# Patient Record
Sex: Female | Born: 1965 | Race: White | Hispanic: No | Marital: Married | Smoking: Former smoker
Health system: Southern US, Community
[De-identification: ages and names within clinical notes are randomized; demographics above are authoritative.]

## PROBLEM LIST (undated history)

## (undated) DIAGNOSIS — C801 Malignant (primary) neoplasm, unspecified: Secondary | ICD-10-CM

## (undated) HISTORY — PX: ABDOMINAL HYSTERECTOMY: SHX81

## (undated) HISTORY — PX: BREAST SURGERY: SHX581

## (undated) HISTORY — DX: Malignant (primary) neoplasm, unspecified: C80.1

## (undated) HISTORY — PX: CHOLECYSTECTOMY: SHX55

---

## 1998-02-16 ENCOUNTER — Other Ambulatory Visit: Admission: RE | Admit: 1998-02-16 | Discharge: 1998-02-16 | Payer: Self-pay | Admitting: Obstetrics and Gynecology

## 1998-09-14 ENCOUNTER — Emergency Department (HOSPITAL_COMMUNITY): Admission: EM | Admit: 1998-09-14 | Discharge: 1998-09-15 | Payer: Self-pay | Admitting: Emergency Medicine

## 1998-09-15 ENCOUNTER — Encounter: Payer: Self-pay | Admitting: Emergency Medicine

## 1999-08-09 ENCOUNTER — Other Ambulatory Visit: Admission: RE | Admit: 1999-08-09 | Discharge: 1999-08-09 | Payer: Self-pay | Admitting: Obstetrics and Gynecology

## 1999-08-29 ENCOUNTER — Encounter: Admission: RE | Admit: 1999-08-29 | Discharge: 1999-08-29 | Payer: Self-pay | Admitting: Obstetrics and Gynecology

## 1999-08-29 ENCOUNTER — Encounter: Payer: Self-pay | Admitting: Obstetrics and Gynecology

## 2000-01-26 ENCOUNTER — Encounter: Payer: Self-pay | Admitting: Obstetrics and Gynecology

## 2000-01-26 ENCOUNTER — Encounter: Admission: RE | Admit: 2000-01-26 | Discharge: 2000-01-26 | Payer: Self-pay | Admitting: Obstetrics and Gynecology

## 2000-08-30 ENCOUNTER — Other Ambulatory Visit: Admission: RE | Admit: 2000-08-30 | Discharge: 2000-08-30 | Payer: Self-pay | Admitting: Obstetrics and Gynecology

## 2001-03-13 ENCOUNTER — Encounter: Payer: Self-pay | Admitting: Obstetrics & Gynecology

## 2001-03-13 ENCOUNTER — Ambulatory Visit (HOSPITAL_COMMUNITY): Admission: RE | Admit: 2001-03-13 | Discharge: 2001-03-13 | Payer: Self-pay | Admitting: Obstetrics & Gynecology

## 2001-05-15 ENCOUNTER — Inpatient Hospital Stay (HOSPITAL_COMMUNITY): Admission: RE | Admit: 2001-05-15 | Discharge: 2001-05-17 | Payer: Self-pay | Admitting: Obstetrics and Gynecology

## 2001-05-15 ENCOUNTER — Encounter (INDEPENDENT_AMBULATORY_CARE_PROVIDER_SITE_OTHER): Payer: Self-pay

## 2001-06-13 ENCOUNTER — Other Ambulatory Visit: Admission: RE | Admit: 2001-06-13 | Discharge: 2001-06-13 | Payer: Self-pay | Admitting: Obstetrics and Gynecology

## 2003-06-16 ENCOUNTER — Emergency Department (HOSPITAL_COMMUNITY): Admission: AD | Admit: 2003-06-16 | Discharge: 2003-06-16 | Payer: Self-pay | Admitting: Family Medicine

## 2003-10-06 ENCOUNTER — Other Ambulatory Visit: Admission: RE | Admit: 2003-10-06 | Discharge: 2003-10-06 | Payer: Self-pay | Admitting: Obstetrics and Gynecology

## 2003-12-23 ENCOUNTER — Ambulatory Visit (HOSPITAL_COMMUNITY): Admission: RE | Admit: 2003-12-23 | Discharge: 2003-12-23 | Payer: Self-pay | Admitting: Obstetrics and Gynecology

## 2003-12-23 ENCOUNTER — Encounter (INDEPENDENT_AMBULATORY_CARE_PROVIDER_SITE_OTHER): Payer: Self-pay | Admitting: Specialist

## 2004-06-20 ENCOUNTER — Emergency Department (HOSPITAL_COMMUNITY): Admission: EM | Admit: 2004-06-20 | Discharge: 2004-06-20 | Payer: Self-pay | Admitting: Family Medicine

## 2005-10-29 ENCOUNTER — Encounter: Admission: RE | Admit: 2005-10-29 | Discharge: 2005-10-29 | Payer: Self-pay | Admitting: Obstetrics and Gynecology

## 2005-11-10 ENCOUNTER — Encounter: Admission: RE | Admit: 2005-11-10 | Discharge: 2005-11-10 | Payer: Self-pay | Admitting: Obstetrics and Gynecology

## 2007-01-01 ENCOUNTER — Ambulatory Visit (HOSPITAL_COMMUNITY): Admission: RE | Admit: 2007-01-01 | Discharge: 2007-01-01 | Payer: Self-pay | Admitting: General Surgery

## 2007-07-05 ENCOUNTER — Inpatient Hospital Stay (HOSPITAL_COMMUNITY): Admission: AD | Admit: 2007-07-05 | Discharge: 2007-07-05 | Payer: Self-pay | Admitting: Obstetrics & Gynecology

## 2007-07-16 ENCOUNTER — Ambulatory Visit (HOSPITAL_COMMUNITY): Admission: RE | Admit: 2007-07-16 | Discharge: 2007-07-16 | Payer: Self-pay | Admitting: Obstetrics and Gynecology

## 2007-07-16 ENCOUNTER — Encounter (INDEPENDENT_AMBULATORY_CARE_PROVIDER_SITE_OTHER): Payer: Self-pay | Admitting: Obstetrics and Gynecology

## 2007-08-31 DIAGNOSIS — C801 Malignant (primary) neoplasm, unspecified: Secondary | ICD-10-CM

## 2007-08-31 HISTORY — DX: Malignant (primary) neoplasm, unspecified: C80.1

## 2007-09-25 ENCOUNTER — Encounter: Admission: RE | Admit: 2007-09-25 | Discharge: 2007-09-25 | Payer: Self-pay | Admitting: Obstetrics and Gynecology

## 2007-10-02 ENCOUNTER — Ambulatory Visit: Payer: Self-pay | Admitting: Oncology

## 2007-10-03 ENCOUNTER — Encounter: Admission: RE | Admit: 2007-10-03 | Discharge: 2007-10-03 | Payer: Self-pay | Admitting: Obstetrics and Gynecology

## 2007-10-08 LAB — COMPREHENSIVE METABOLIC PANEL
Albumin: 4.4 g/dL (ref 3.5–5.2)
Alkaline Phosphatase: 45 U/L (ref 39–117)
BUN: 10 mg/dL (ref 6–23)
Calcium: 8.9 mg/dL (ref 8.4–10.5)
Glucose, Bld: 88 mg/dL (ref 70–99)
Potassium: 4.1 mEq/L (ref 3.5–5.3)

## 2007-10-08 LAB — CBC WITH DIFFERENTIAL/PLATELET
BASO%: 1.9 % (ref 0.0–2.0)
Basophils Absolute: 0.1 10*3/uL (ref 0.0–0.1)
EOS%: 1.5 % (ref 0.0–7.0)
HCT: 39.2 % (ref 34.8–46.6)
HGB: 13.8 g/dL (ref 11.6–15.9)
LYMPH%: 44.5 % (ref 14.0–48.0)
MCH: 32 pg (ref 26.0–34.0)
MCHC: 35.3 g/dL (ref 32.0–36.0)
MCV: 90.8 fL (ref 81.0–101.0)
MONO%: 10.5 % (ref 0.0–13.0)
NEUT%: 41.6 % (ref 39.6–76.8)

## 2007-10-08 LAB — CANCER ANTIGEN 27.29: CA 27.29: 19 U/mL (ref 0–39)

## 2007-10-08 LAB — LACTATE DEHYDROGENASE: LDH: 105 U/L (ref 94–250)

## 2007-10-09 LAB — VITAMIN D 25 HYDROXY (VIT D DEFICIENCY, FRACTURES): Vit D, 25-Hydroxy: 46 ng/mL (ref 30–89)

## 2007-10-10 ENCOUNTER — Encounter: Payer: Self-pay | Admitting: Oncology

## 2007-10-10 ENCOUNTER — Ambulatory Visit: Payer: Self-pay

## 2007-10-13 ENCOUNTER — Encounter: Admission: RE | Admit: 2007-10-13 | Discharge: 2007-10-13 | Payer: Self-pay | Admitting: Oncology

## 2007-10-14 ENCOUNTER — Ambulatory Visit (HOSPITAL_BASED_OUTPATIENT_CLINIC_OR_DEPARTMENT_OTHER): Admission: RE | Admit: 2007-10-14 | Discharge: 2007-10-14 | Payer: Self-pay | Admitting: General Surgery

## 2007-10-21 ENCOUNTER — Ambulatory Visit (HOSPITAL_COMMUNITY): Admission: RE | Admit: 2007-10-21 | Discharge: 2007-10-21 | Payer: Self-pay | Admitting: Hematology and Oncology

## 2007-10-23 ENCOUNTER — Ambulatory Visit (HOSPITAL_COMMUNITY): Admission: RE | Admit: 2007-10-23 | Discharge: 2007-10-23 | Payer: Self-pay | Admitting: Oncology

## 2007-10-31 LAB — CBC WITH DIFFERENTIAL/PLATELET
EOS%: 0.1 % (ref 0.0–7.0)
MCH: 31.7 pg (ref 26.0–34.0)
MCV: 91 fL (ref 81.0–101.0)
MONO%: 10.5 % (ref 0.0–13.0)
RBC: 4.22 10*6/uL (ref 3.70–5.32)
RDW: 11.6 % (ref 11.3–14.5)

## 2007-10-31 LAB — COMPREHENSIVE METABOLIC PANEL
AST: 16 U/L (ref 0–37)
Albumin: 4.1 g/dL (ref 3.5–5.2)
Alkaline Phosphatase: 58 U/L (ref 39–117)
Potassium: 4.3 mEq/L (ref 3.5–5.3)
Sodium: 139 mEq/L (ref 135–145)
Total Protein: 6.5 g/dL (ref 6.0–8.3)

## 2007-11-07 LAB — CBC WITH DIFFERENTIAL/PLATELET
BASO%: 1.7 % (ref 0.0–2.0)
EOS%: 0.7 % (ref 0.0–7.0)
HCT: 37.5 % (ref 34.8–46.6)
MCH: 31.7 pg (ref 26.0–34.0)
MCHC: 34.4 g/dL (ref 32.0–36.0)
MONO#: 0.9 10*3/uL (ref 0.1–0.9)
RBC: 4.07 10*6/uL (ref 3.70–5.32)
RDW: 12.1 % (ref 11.3–14.5)
WBC: 5.9 10*3/uL (ref 3.9–10.0)
lymph#: 2.5 10*3/uL (ref 0.9–3.3)

## 2007-11-07 LAB — COMPREHENSIVE METABOLIC PANEL
ALT: 42 U/L — ABNORMAL HIGH (ref 0–35)
AST: 21 U/L (ref 0–37)
Albumin: 4 g/dL (ref 3.5–5.2)
CO2: 24 mEq/L (ref 19–32)
Calcium: 8.7 mg/dL (ref 8.4–10.5)
Chloride: 106 mEq/L (ref 96–112)
Creatinine, Ser: 0.64 mg/dL (ref 0.40–1.20)
Potassium: 4.4 mEq/L (ref 3.5–5.3)
Total Protein: 6.3 g/dL (ref 6.0–8.3)

## 2007-11-12 ENCOUNTER — Ambulatory Visit: Payer: Self-pay | Admitting: Oncology

## 2007-11-14 LAB — BASIC METABOLIC PANEL
BUN: 16 mg/dL (ref 6–23)
CO2: 24 mEq/L (ref 19–32)
Chloride: 103 mEq/L (ref 96–112)
Creatinine, Ser: 0.58 mg/dL (ref 0.40–1.20)
Potassium: 4.2 mEq/L (ref 3.5–5.3)

## 2007-11-14 LAB — CBC WITH DIFFERENTIAL/PLATELET
Basophils Absolute: 0.1 10*3/uL (ref 0.0–0.1)
EOS%: 2 % (ref 0.0–7.0)
HCT: 38 % (ref 34.8–46.6)
HGB: 13.2 g/dL (ref 11.6–15.9)
MONO#: 0.4 10*3/uL (ref 0.1–0.9)
NEUT#: 2.5 10*3/uL (ref 1.5–6.5)
NEUT%: 52.3 % (ref 39.6–76.8)
RDW: 11.5 % (ref 11.3–14.5)
WBC: 4.8 10*3/uL (ref 3.9–10.0)
lymph#: 1.7 10*3/uL (ref 0.9–3.3)

## 2007-11-21 LAB — COMPREHENSIVE METABOLIC PANEL
Albumin: 3.8 g/dL (ref 3.5–5.2)
Alkaline Phosphatase: 50 U/L (ref 39–117)
BUN: 14 mg/dL (ref 6–23)
CO2: 26 mEq/L (ref 19–32)
Glucose, Bld: 116 mg/dL — ABNORMAL HIGH (ref 70–99)
Total Bilirubin: 0.4 mg/dL (ref 0.3–1.2)
Total Protein: 6.1 g/dL (ref 6.0–8.3)

## 2007-11-21 LAB — CBC WITH DIFFERENTIAL/PLATELET
Basophils Absolute: 0.1 10*3/uL (ref 0.0–0.1)
EOS%: 0.6 % (ref 0.0–7.0)
Eosinophils Absolute: 0 10*3/uL (ref 0.0–0.5)
HGB: 12.6 g/dL (ref 11.6–15.9)
LYMPH%: 39.1 % (ref 14.0–48.0)
MCH: 31.8 pg (ref 26.0–34.0)
MCV: 89.4 fL (ref 81.0–101.0)
MONO%: 7.8 % (ref 0.0–13.0)
NEUT#: 3.1 10*3/uL (ref 1.5–6.5)
Platelets: 183 10*3/uL (ref 145–400)
RDW: 12.2 % (ref 11.3–14.5)

## 2007-11-21 LAB — URINALYSIS, MICROSCOPIC - CHCC
Bilirubin (Urine): NEGATIVE
Blood: NEGATIVE
Glucose: NEGATIVE g/dL
Ketones: NEGATIVE mg/dL
Leukocyte Esterase: NEGATIVE
Nitrite: NEGATIVE
Protein: NEGATIVE mg/dL
RBC count: NEGATIVE (ref 0–2)
Specific Gravity, Urine: 1.03 (ref 1.003–1.035)
WBC, UA: NEGATIVE (ref 0–2)
pH: 6 (ref 4.6–8.0)

## 2007-11-27 LAB — CLOSTRIDIUM DIFFICILE EIA

## 2007-11-28 LAB — CBC WITH DIFFERENTIAL/PLATELET
BASO%: 1.2 % (ref 0.0–2.0)
Basophils Absolute: 0 10e3/uL (ref 0.0–0.1)
EOS%: 0.3 % (ref 0.0–7.0)
Eosinophils Absolute: 0 10e3/uL (ref 0.0–0.5)
HCT: 35.7 % (ref 34.8–46.6)
HGB: 12.3 g/dL (ref 11.6–15.9)
LYMPH%: 53.1 % — ABNORMAL HIGH (ref 14.0–48.0)
MCH: 31.4 pg (ref 26.0–34.0)
MCHC: 34.6 g/dL (ref 32.0–36.0)
MCV: 90.9 fL (ref 81.0–101.0)
MONO#: 0.3 10e3/uL (ref 0.1–0.9)
MONO%: 8.2 % (ref 0.0–13.0)
NEUT#: 1.4 10e3/uL — ABNORMAL LOW (ref 1.5–6.5)
NEUT%: 37.3 % — ABNORMAL LOW (ref 39.6–76.8)
Platelets: 155 10e3/uL (ref 145–400)
RBC: 3.93 10e6/uL (ref 3.70–5.32)
RDW: 12.7 % (ref 11.3–14.5)
WBC: 3.7 10e3/uL — ABNORMAL LOW (ref 3.9–10.0)
lymph#: 2 10e3/uL (ref 0.9–3.3)

## 2007-11-28 LAB — COMPREHENSIVE METABOLIC PANEL
ALT: 83 U/L — ABNORMAL HIGH (ref 0–35)
Albumin: 4.1 g/dL (ref 3.5–5.2)
Alkaline Phosphatase: 57 U/L (ref 39–117)
CO2: 25 mEq/L (ref 19–32)
Glucose, Bld: 90 mg/dL (ref 70–99)
Potassium: 3.9 mEq/L (ref 3.5–5.3)
Sodium: 136 mEq/L (ref 135–145)
Total Bilirubin: 0.8 mg/dL (ref 0.3–1.2)
Total Protein: 6.5 g/dL (ref 6.0–8.3)

## 2007-12-05 LAB — CBC WITH DIFFERENTIAL/PLATELET
EOS%: 0.3 % (ref 0.0–7.0)
Eosinophils Absolute: 0 10*3/uL (ref 0.0–0.5)
LYMPH%: 53.7 % — ABNORMAL HIGH (ref 14.0–48.0)
MCH: 31.9 pg (ref 26.0–34.0)
MCV: 93.1 fL (ref 81.0–101.0)
MONO%: 14.2 % — ABNORMAL HIGH (ref 0.0–13.0)
NEUT#: 1 10*3/uL — ABNORMAL LOW (ref 1.5–6.5)
Platelets: 174 10*3/uL (ref 145–400)
RBC: 3.87 10*6/uL (ref 3.70–5.32)

## 2007-12-05 LAB — COMPREHENSIVE METABOLIC PANEL
ALT: 78 U/L — ABNORMAL HIGH (ref 0–35)
AST: 43 U/L — ABNORMAL HIGH (ref 0–37)
Alkaline Phosphatase: 59 U/L (ref 39–117)
Glucose, Bld: 112 mg/dL — ABNORMAL HIGH (ref 70–99)
Potassium: 4.1 mEq/L (ref 3.5–5.3)
Sodium: 141 mEq/L (ref 135–145)
Total Bilirubin: 0.6 mg/dL (ref 0.3–1.2)
Total Protein: 6.5 g/dL (ref 6.0–8.3)

## 2007-12-08 LAB — VITAMIN D 25 HYDROXY (VIT D DEFICIENCY, FRACTURES): Vit D, 25-Hydroxy: 39 ng/mL (ref 30–89)

## 2007-12-12 LAB — COMPREHENSIVE METABOLIC PANEL
Albumin: 3.8 g/dL (ref 3.5–5.2)
Alkaline Phosphatase: 58 U/L (ref 39–117)
CO2: 27 mEq/L (ref 19–32)
Calcium: 8.8 mg/dL (ref 8.4–10.5)
Chloride: 105 mEq/L (ref 96–112)
Glucose, Bld: 97 mg/dL (ref 70–99)
Potassium: 4 mEq/L (ref 3.5–5.3)
Sodium: 142 mEq/L (ref 135–145)
Total Protein: 6 g/dL (ref 6.0–8.3)

## 2007-12-12 LAB — CBC WITH DIFFERENTIAL/PLATELET
Basophils Absolute: 0.1 10*3/uL (ref 0.0–0.1)
Eosinophils Absolute: 0 10*3/uL (ref 0.0–0.5)
HGB: 11.6 g/dL (ref 11.6–15.9)
MONO#: 0.3 10*3/uL (ref 0.1–0.9)
MONO%: 7.2 % (ref 0.0–13.0)
NEUT#: 1.4 10*3/uL — ABNORMAL LOW (ref 1.5–6.5)
RBC: 3.61 10*6/uL — ABNORMAL LOW (ref 3.70–5.32)
RDW: 14.4 % (ref 11.3–14.5)
WBC: 4.5 10*3/uL (ref 3.9–10.0)
lymph#: 2.7 10*3/uL (ref 0.9–3.3)

## 2007-12-23 ENCOUNTER — Encounter: Admission: RE | Admit: 2007-12-23 | Discharge: 2007-12-23 | Payer: Self-pay | Admitting: Oncology

## 2007-12-24 ENCOUNTER — Encounter: Payer: Self-pay | Admitting: Oncology

## 2007-12-24 ENCOUNTER — Ambulatory Visit: Admission: RE | Admit: 2007-12-24 | Discharge: 2007-12-24 | Payer: Self-pay | Admitting: Oncology

## 2007-12-24 ENCOUNTER — Ambulatory Visit: Payer: Self-pay | Admitting: Cardiology

## 2007-12-24 ENCOUNTER — Ambulatory Visit: Payer: Self-pay | Admitting: Oncology

## 2007-12-26 LAB — COMPREHENSIVE METABOLIC PANEL
AST: 23 U/L (ref 0–37)
Albumin: 4.1 g/dL (ref 3.5–5.2)
Alkaline Phosphatase: 61 U/L (ref 39–117)
Glucose, Bld: 139 mg/dL — ABNORMAL HIGH (ref 70–99)
Potassium: 3.8 mEq/L (ref 3.5–5.3)
Sodium: 142 mEq/L (ref 135–145)
Total Protein: 6.3 g/dL (ref 6.0–8.3)

## 2007-12-26 LAB — CBC WITH DIFFERENTIAL/PLATELET
EOS%: 0.8 % (ref 0.0–7.0)
Eosinophils Absolute: 0 10*3/uL (ref 0.0–0.5)
MCV: 95.1 fL (ref 81.0–101.0)
MONO%: 11.6 % (ref 0.0–13.0)
NEUT#: 1.1 10*3/uL — ABNORMAL LOW (ref 1.5–6.5)
RBC: 3.36 10*6/uL — ABNORMAL LOW (ref 3.70–5.32)
RDW: 15.9 % — ABNORMAL HIGH (ref 11.3–14.5)
lymph#: 2.3 10*3/uL (ref 0.9–3.3)

## 2008-01-02 ENCOUNTER — Encounter: Payer: Self-pay | Admitting: Internal Medicine

## 2008-01-02 LAB — CBC WITH DIFFERENTIAL/PLATELET
Basophils Absolute: 0.1 10*3/uL (ref 0.0–0.1)
Eosinophils Absolute: 0 10*3/uL (ref 0.0–0.5)
HCT: 34.2 % — ABNORMAL LOW (ref 34.8–46.6)
LYMPH%: 50.5 % — ABNORMAL HIGH (ref 14.0–48.0)
MCV: 94.8 fL (ref 81.0–101.0)
MONO#: 0.7 10*3/uL (ref 0.1–0.9)
MONO%: 14.8 % — ABNORMAL HIGH (ref 0.0–13.0)
NEUT#: 1.5 10*3/uL (ref 1.5–6.5)
NEUT%: 32.5 % — ABNORMAL LOW (ref 39.6–76.8)
Platelets: 165 10*3/uL (ref 145–400)
WBC: 4.6 10*3/uL (ref 3.9–10.0)

## 2008-01-02 LAB — COMPREHENSIVE METABOLIC PANEL
Alkaline Phosphatase: 62 U/L (ref 39–117)
CO2: 25 mEq/L (ref 19–32)
Creatinine, Ser: 0.62 mg/dL (ref 0.40–1.20)
Glucose, Bld: 98 mg/dL (ref 70–99)
Sodium: 140 mEq/L (ref 135–145)
Total Bilirubin: 0.6 mg/dL (ref 0.3–1.2)
Total Protein: 6.4 g/dL (ref 6.0–8.3)

## 2008-01-09 LAB — CBC WITH DIFFERENTIAL/PLATELET
BASO%: 1.7 % (ref 0.0–2.0)
EOS%: 0.9 % (ref 0.0–7.0)
HCT: 32.5 % — ABNORMAL LOW (ref 34.8–46.6)
LYMPH%: 59 % — ABNORMAL HIGH (ref 14.0–48.0)
MCH: 33.4 pg (ref 26.0–34.0)
MCHC: 35.9 g/dL (ref 32.0–36.0)
MCV: 92.8 fL (ref 81.0–101.0)
MONO#: 0.3 10*3/uL (ref 0.1–0.9)
MONO%: 6.9 % (ref 0.0–13.0)
NEUT%: 31.6 % — ABNORMAL LOW (ref 39.6–76.8)
Platelets: 100 10*3/uL — ABNORMAL LOW (ref 145–400)
RBC: 3.51 10*6/uL — ABNORMAL LOW (ref 3.70–5.32)

## 2008-01-16 ENCOUNTER — Encounter: Payer: Self-pay | Admitting: Internal Medicine

## 2008-01-16 LAB — CBC WITH DIFFERENTIAL/PLATELET
BASO%: 1.4 % (ref 0.0–2.0)
EOS%: 0.7 % (ref 0.0–7.0)
HCT: 31.1 % — ABNORMAL LOW (ref 34.8–46.6)
LYMPH%: 65.1 % — ABNORMAL HIGH (ref 14.0–48.0)
MCH: 33.7 pg (ref 26.0–34.0)
MCHC: 35.8 g/dL (ref 32.0–36.0)
NEUT%: 26.6 % — ABNORMAL LOW (ref 39.6–76.8)
RBC: 3.31 10*6/uL — ABNORMAL LOW (ref 3.70–5.32)
lymph#: 2.4 10*3/uL (ref 0.9–3.3)

## 2008-01-23 LAB — CBC WITH DIFFERENTIAL/PLATELET
BASO%: 1.1 % (ref 0.0–2.0)
EOS%: 1.1 % (ref 0.0–7.0)
HGB: 10.1 g/dL — ABNORMAL LOW (ref 11.6–15.9)
MCH: 34.1 pg — ABNORMAL HIGH (ref 26.0–34.0)
MCHC: 35.9 g/dL (ref 32.0–36.0)
MONO#: 0.4 10*3/uL (ref 0.1–0.9)
RDW: 15.1 % — ABNORMAL HIGH (ref 11.3–14.5)
WBC: 3.2 10*3/uL — ABNORMAL LOW (ref 3.9–10.0)
lymph#: 2 10*3/uL (ref 0.9–3.3)

## 2008-01-29 ENCOUNTER — Encounter: Payer: Self-pay | Admitting: Internal Medicine

## 2008-01-29 LAB — COMPREHENSIVE METABOLIC PANEL
Albumin: 3.9 g/dL (ref 3.5–5.2)
Alkaline Phosphatase: 58 U/L (ref 39–117)
BUN: 16 mg/dL (ref 6–23)
Calcium: 8.7 mg/dL (ref 8.4–10.5)
Glucose, Bld: 103 mg/dL — ABNORMAL HIGH (ref 70–99)
Potassium: 4.1 mEq/L (ref 3.5–5.3)

## 2008-01-29 LAB — CBC WITH DIFFERENTIAL/PLATELET
BASO%: 1.1 % (ref 0.0–2.0)
Eosinophils Absolute: 0 10*3/uL (ref 0.0–0.5)
LYMPH%: 65.3 % — ABNORMAL HIGH (ref 14.0–48.0)
MONO#: 0.3 10*3/uL (ref 0.1–0.9)
NEUT#: 0.7 10*3/uL — ABNORMAL LOW (ref 1.5–6.5)
Platelets: 117 10*3/uL — ABNORMAL LOW (ref 145–400)
RBC: 2.92 10*6/uL — ABNORMAL LOW (ref 3.70–5.32)
RDW: 16 % — ABNORMAL HIGH (ref 11.3–14.5)
WBC: 3.1 10*3/uL — ABNORMAL LOW (ref 3.9–10.0)
lymph#: 2 10*3/uL (ref 0.9–3.3)

## 2008-02-03 ENCOUNTER — Ambulatory Visit: Payer: Self-pay | Admitting: Oncology

## 2008-02-06 LAB — CBC WITH DIFFERENTIAL/PLATELET
Basophils Absolute: 0 10*3/uL (ref 0.0–0.1)
Eosinophils Absolute: 0 10*3/uL (ref 0.0–0.5)
HGB: 10.4 g/dL — ABNORMAL LOW (ref 11.6–15.9)
MCV: 99 fL (ref 81.0–101.0)
MONO#: 0.5 10*3/uL (ref 0.1–0.9)
NEUT#: 0.7 10*3/uL — ABNORMAL LOW (ref 1.5–6.5)
RDW: 15.8 % — ABNORMAL HIGH (ref 11.3–14.5)
lymph#: 2.1 10*3/uL (ref 0.9–3.3)

## 2008-02-13 ENCOUNTER — Encounter: Payer: Self-pay | Admitting: Internal Medicine

## 2008-02-13 LAB — CBC WITH DIFFERENTIAL/PLATELET
Basophils Absolute: 0 10*3/uL (ref 0.0–0.1)
Eosinophils Absolute: 0 10*3/uL (ref 0.0–0.5)
HGB: 10.3 g/dL — ABNORMAL LOW (ref 11.6–15.9)
LYMPH%: 56.5 % — ABNORMAL HIGH (ref 14.0–48.0)
MCV: 98.7 fL (ref 81.0–101.0)
MONO%: 14.5 % — ABNORMAL HIGH (ref 0.0–13.0)
NEUT#: 0.9 10*3/uL — ABNORMAL LOW (ref 1.5–6.5)
Platelets: 300 10*3/uL (ref 145–400)

## 2008-02-13 LAB — COMPREHENSIVE METABOLIC PANEL
Albumin: 3.7 g/dL (ref 3.5–5.2)
Alkaline Phosphatase: 60 U/L (ref 39–117)
BUN: 9 mg/dL (ref 6–23)
CO2: 25 mEq/L (ref 19–32)
Glucose, Bld: 127 mg/dL — ABNORMAL HIGH (ref 70–99)
Potassium: 3.9 mEq/L (ref 3.5–5.3)
Total Bilirubin: 0.4 mg/dL (ref 0.3–1.2)

## 2008-02-20 ENCOUNTER — Encounter: Payer: Self-pay | Admitting: Internal Medicine

## 2008-02-20 LAB — CBC WITH DIFFERENTIAL/PLATELET
Basophils Absolute: 0.1 10*3/uL (ref 0.0–0.1)
Eosinophils Absolute: 0 10*3/uL (ref 0.0–0.5)
HGB: 11.3 g/dL — ABNORMAL LOW (ref 11.6–15.9)
MCV: 97.9 fL (ref 81.0–101.0)
MONO#: 0.7 10*3/uL (ref 0.1–0.9)
MONO%: 13.9 % — ABNORMAL HIGH (ref 0.0–13.0)
NEUT#: 1.9 10*3/uL (ref 1.5–6.5)
RDW: 12.9 % (ref 11.3–14.5)
WBC: 4.7 10*3/uL (ref 3.9–10.0)

## 2008-02-27 ENCOUNTER — Encounter: Payer: Self-pay | Admitting: Internal Medicine

## 2008-02-27 LAB — CBC WITH DIFFERENTIAL/PLATELET
BASO%: 1.3 % (ref 0.0–2.0)
Eosinophils Absolute: 0.1 10*3/uL (ref 0.0–0.5)
HCT: 35.9 % (ref 34.8–46.6)
LYMPH%: 35.3 % (ref 14.0–48.0)
MCHC: 34.3 g/dL (ref 32.0–36.0)
MCV: 95.4 fL (ref 81.0–101.0)
MONO#: 0.6 10*3/uL (ref 0.1–0.9)
MONO%: 13.3 % — ABNORMAL HIGH (ref 0.0–13.0)
NEUT%: 48.7 % (ref 39.6–76.8)
Platelets: 227 10*3/uL (ref 145–400)
RBC: 3.76 10*6/uL (ref 3.70–5.32)

## 2008-02-28 HISTORY — PX: MASTECTOMY: SHX3

## 2008-04-16 ENCOUNTER — Ambulatory Visit: Payer: Self-pay | Admitting: Oncology

## 2008-04-22 ENCOUNTER — Encounter: Payer: Self-pay | Admitting: Internal Medicine

## 2008-04-22 LAB — CBC WITH DIFFERENTIAL/PLATELET
BASO%: 1.4 % (ref 0.0–2.0)
Eosinophils Absolute: 0.2 10*3/uL (ref 0.0–0.5)
MCHC: 34.8 g/dL (ref 32.0–36.0)
MCV: 88 fL (ref 81.0–101.0)
MONO#: 0.6 10*3/uL (ref 0.1–0.9)
MONO%: 10.4 % (ref 0.0–13.0)
NEUT#: 2.6 10*3/uL (ref 1.5–6.5)
RBC: 4.07 10*6/uL (ref 3.70–5.32)
RDW: 12.5 % (ref 11.3–14.5)
WBC: 6.1 10*3/uL (ref 3.9–10.0)

## 2008-04-29 ENCOUNTER — Ambulatory Visit: Payer: Self-pay | Admitting: Cardiology

## 2008-04-29 ENCOUNTER — Ambulatory Visit (HOSPITAL_COMMUNITY): Admission: RE | Admit: 2008-04-29 | Discharge: 2008-04-29 | Payer: Self-pay | Admitting: Oncology

## 2008-04-29 ENCOUNTER — Encounter: Payer: Self-pay | Admitting: Oncology

## 2008-05-14 ENCOUNTER — Encounter: Payer: Self-pay | Admitting: Internal Medicine

## 2008-05-14 LAB — CBC WITH DIFFERENTIAL/PLATELET
EOS%: 1.2 % (ref 0.0–7.0)
MCH: 29.5 pg (ref 26.0–34.0)
MCV: 86.1 fL (ref 81.0–101.0)
MONO%: 10.1 % (ref 0.0–13.0)
RBC: 4.52 10*6/uL (ref 3.70–5.32)
RDW: 12 % (ref 11.3–14.5)

## 2008-05-17 LAB — COMPREHENSIVE METABOLIC PANEL
ALT: 16 U/L (ref 0–35)
AST: 15 U/L (ref 0–37)
Albumin: 4 g/dL (ref 3.5–5.2)
Calcium: 9.1 mg/dL (ref 8.4–10.5)
Chloride: 107 mEq/L (ref 96–112)
Potassium: 4.2 mEq/L (ref 3.5–5.3)
Total Protein: 6.3 g/dL (ref 6.0–8.3)

## 2008-05-17 LAB — VITAMIN D 25 HYDROXY (VIT D DEFICIENCY, FRACTURES): Vit D, 25-Hydroxy: 40 ng/mL (ref 30–89)

## 2008-06-02 ENCOUNTER — Ambulatory Visit: Payer: Self-pay | Admitting: Oncology

## 2008-06-04 ENCOUNTER — Encounter: Payer: Self-pay | Admitting: Internal Medicine

## 2008-06-04 LAB — CBC WITH DIFFERENTIAL/PLATELET
Basophils Absolute: 0.1 10*3/uL (ref 0.0–0.1)
HCT: 38 % (ref 34.8–46.6)
HGB: 13.4 g/dL (ref 11.6–15.9)
LYMPH%: 44.7 % (ref 14.0–48.0)
MONO#: 0.6 10*3/uL (ref 0.1–0.9)
NEUT%: 44.2 % (ref 39.6–76.8)
Platelets: 198 10*3/uL (ref 145–400)
WBC: 6.4 10*3/uL (ref 3.9–10.0)
lymph#: 2.9 10*3/uL (ref 0.9–3.3)

## 2008-06-04 LAB — COMPREHENSIVE METABOLIC PANEL
BUN: 17 mg/dL (ref 6–23)
CO2: 21 mEq/L (ref 19–32)
Calcium: 9.1 mg/dL (ref 8.4–10.5)
Chloride: 109 mEq/L (ref 96–112)
Creatinine, Ser: 0.89 mg/dL (ref 0.40–1.20)
Glucose, Bld: 107 mg/dL — ABNORMAL HIGH (ref 70–99)
Total Bilirubin: 0.4 mg/dL (ref 0.3–1.2)

## 2008-06-25 ENCOUNTER — Encounter: Payer: Self-pay | Admitting: Internal Medicine

## 2008-06-25 LAB — CBC WITH DIFFERENTIAL/PLATELET
BASO%: 0.8 % (ref 0.0–2.0)
Basophils Absolute: 0 10*3/uL (ref 0.0–0.1)
Eosinophils Absolute: 0.1 10*3/uL (ref 0.0–0.5)
HCT: 35.6 % (ref 34.8–46.6)
HGB: 12.5 g/dL (ref 11.6–15.9)
MCHC: 35.2 g/dL (ref 32.0–36.0)
MONO#: 0.6 10*3/uL (ref 0.1–0.9)
NEUT#: 2.9 10*3/uL (ref 1.5–6.5)
NEUT%: 47 % (ref 39.6–76.8)
WBC: 6.1 10*3/uL (ref 3.9–10.0)
lymph#: 2.6 10*3/uL (ref 0.9–3.3)

## 2008-06-28 LAB — URINALYSIS, MICROSCOPIC - CHCC
Ketones: NEGATIVE mg/dL
Protein: NEGATIVE mg/dL
Specific Gravity, Urine: 1.025 (ref 1.003–1.035)
WBC, UA: NEGATIVE (ref 0–2)

## 2008-06-30 LAB — URINE CULTURE

## 2008-07-16 LAB — COMPREHENSIVE METABOLIC PANEL
Albumin: 3.4 g/dL — ABNORMAL LOW (ref 3.5–5.2)
Alkaline Phosphatase: 62 U/L (ref 39–117)
BUN: 11 mg/dL (ref 6–23)
CO2: 23 mEq/L (ref 19–32)
Glucose, Bld: 132 mg/dL — ABNORMAL HIGH (ref 70–99)
Total Bilirubin: 0.6 mg/dL (ref 0.3–1.2)
Total Protein: 5.4 g/dL — ABNORMAL LOW (ref 6.0–8.3)

## 2008-07-16 LAB — CBC WITH DIFFERENTIAL/PLATELET
Basophils Absolute: 0 10*3/uL (ref 0.0–0.1)
Eosinophils Absolute: 0.1 10*3/uL (ref 0.0–0.5)
HCT: 36.3 % (ref 34.8–46.6)
HGB: 12.9 g/dL (ref 11.6–15.9)
LYMPH%: 49 % — ABNORMAL HIGH (ref 14.0–48.0)
MCV: 84.5 fL (ref 81.0–101.0)
MONO#: 0.6 10*3/uL (ref 0.1–0.9)
MONO%: 10.9 % (ref 0.0–13.0)
NEUT#: 2 10*3/uL (ref 1.5–6.5)
Platelets: 189 10*3/uL (ref 145–400)
WBC: 5.1 10*3/uL (ref 3.9–10.0)

## 2008-08-02 ENCOUNTER — Encounter: Payer: Self-pay | Admitting: Oncology

## 2008-08-02 ENCOUNTER — Ambulatory Visit: Payer: Self-pay

## 2008-08-04 ENCOUNTER — Ambulatory Visit: Payer: Self-pay | Admitting: Oncology

## 2008-08-06 ENCOUNTER — Encounter: Payer: Self-pay | Admitting: Internal Medicine

## 2008-08-06 LAB — CBC WITH DIFFERENTIAL/PLATELET
Basophils Absolute: 0 10*3/uL (ref 0.0–0.1)
Eosinophils Absolute: 0.1 10*3/uL (ref 0.0–0.5)
HGB: 12.2 g/dL (ref 11.6–15.9)
LYMPH%: 54.4 % — ABNORMAL HIGH (ref 14.0–48.0)
MCV: 86.6 fL (ref 81.0–101.0)
MONO#: 0.5 10*3/uL (ref 0.1–0.9)
MONO%: 10.7 % (ref 0.0–13.0)
NEUT#: 1.5 10*3/uL (ref 1.5–6.5)
Platelets: 174 10*3/uL (ref 145–400)
RDW: 11.8 % (ref 11.3–14.5)

## 2008-08-06 LAB — COMPREHENSIVE METABOLIC PANEL
Alkaline Phosphatase: 68 U/L (ref 39–117)
BUN: 12 mg/dL (ref 6–23)
Glucose, Bld: 93 mg/dL (ref 70–99)
Total Bilirubin: 0.7 mg/dL (ref 0.3–1.2)

## 2008-08-21 ENCOUNTER — Emergency Department (HOSPITAL_COMMUNITY): Admission: EM | Admit: 2008-08-21 | Discharge: 2008-08-21 | Payer: Self-pay | Admitting: Emergency Medicine

## 2008-08-27 LAB — CBC WITH DIFFERENTIAL/PLATELET
Basophils Absolute: 0.1 10*3/uL (ref 0.0–0.1)
Eosinophils Absolute: 0.1 10*3/uL (ref 0.0–0.5)
HCT: 37.3 % (ref 34.8–46.6)
LYMPH%: 57.8 % — ABNORMAL HIGH (ref 14.0–48.0)
MCV: 87.3 fL (ref 81.0–101.0)
MONO%: 9.2 % (ref 0.0–13.0)
NEUT#: 1.6 10*3/uL (ref 1.5–6.5)
NEUT%: 30.6 % — ABNORMAL LOW (ref 39.6–76.8)
Platelets: 171 10*3/uL (ref 145–400)
RBC: 4.27 10*6/uL (ref 3.70–5.32)

## 2008-08-27 LAB — COMPREHENSIVE METABOLIC PANEL
Alkaline Phosphatase: 79 U/L (ref 39–117)
BUN: 12 mg/dL (ref 6–23)
Creatinine, Ser: 0.58 mg/dL (ref 0.40–1.20)
Glucose, Bld: 99 mg/dL (ref 70–99)
Sodium: 137 mEq/L (ref 135–145)
Total Bilirubin: 0.7 mg/dL (ref 0.3–1.2)

## 2008-10-06 ENCOUNTER — Ambulatory Visit: Payer: Self-pay | Admitting: Oncology

## 2008-10-08 ENCOUNTER — Encounter: Payer: Self-pay | Admitting: Internal Medicine

## 2008-10-08 LAB — CBC WITH DIFFERENTIAL/PLATELET
Eosinophils Absolute: 0.1 10*3/uL (ref 0.0–0.5)
MCV: 84.5 fL (ref 79.5–101.0)
MONO%: 8.7 % (ref 0.0–14.0)
NEUT#: 1.5 10*3/uL (ref 1.5–6.5)
RBC: 4.38 10*6/uL (ref 3.70–5.45)
RDW: 11.9 % (ref 11.2–14.5)
WBC: 5.4 10*3/uL (ref 3.9–10.3)
nRBC: 0 % (ref 0–0)

## 2009-01-04 ENCOUNTER — Ambulatory Visit: Payer: Self-pay | Admitting: Oncology

## 2009-02-02 ENCOUNTER — Encounter: Payer: Self-pay | Admitting: Internal Medicine

## 2009-02-02 LAB — CBC WITH DIFFERENTIAL/PLATELET
Basophils Absolute: 0 10*3/uL (ref 0.0–0.1)
EOS%: 1 % (ref 0.0–7.0)
HCT: 37.5 % (ref 34.8–46.6)
HGB: 13 g/dL (ref 11.6–15.9)
MCH: 30.3 pg (ref 25.1–34.0)
NEUT%: 27.9 % — ABNORMAL LOW (ref 38.4–76.8)
lymph#: 2.8 10*3/uL (ref 0.9–3.3)

## 2009-02-02 LAB — COMPREHENSIVE METABOLIC PANEL
BUN: 12 mg/dL (ref 6–23)
CO2: 28 mEq/L (ref 19–32)
Creatinine, Ser: 0.56 mg/dL (ref 0.40–1.20)
Glucose, Bld: 97 mg/dL (ref 70–99)
Total Bilirubin: 0.9 mg/dL (ref 0.3–1.2)
Total Protein: 6.4 g/dL (ref 6.0–8.3)

## 2009-02-02 LAB — LACTATE DEHYDROGENASE: LDH: 110 U/L (ref 94–250)

## 2009-02-03 LAB — CANCER ANTIGEN 27.29: CA 27.29: 21 U/mL (ref 0–39)

## 2009-03-11 IMAGING — CT CT PELVIS W/ CM
4 of 5 series · 12 of 46 positions shown, 18 images · IV contrast (READICAT/WATER & [ID] OMNI 300)
Comparison: None.
CHEST CT WITH CONTRAST:

CLINICAL DATA: New diagnosis of breast cancer.  Upper abdominal pain.
CT CHEST, ABDOMEN, AND PELVIS WITH CONTRAST:
Contrast:  100 cc of Omnipaque 300.
TECHNIQUE: Multidetector CT imaging of the chest was performed following the standard protocol during bolus administration of intravenous contrast.
TECHNIQUE: Multidetector CT imaging of the abdomen was performed following the standard protocol during bolus administration of intravenous contrast.
TECHNIQUE: Multidetector CT imaging of the pelvis was performed following the standard protocol during bolus administration of intravenous contrast.

[Series 3: chest/abd/pelvis · axial · 0.70mm/px · z∈[-498,-218]mm · 5 of 119 slices shown]
[im 14/119  soft-tissue]
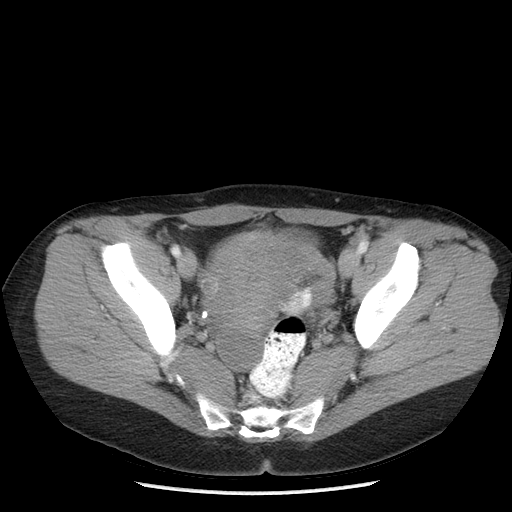
[im 28/119  soft-tissue]
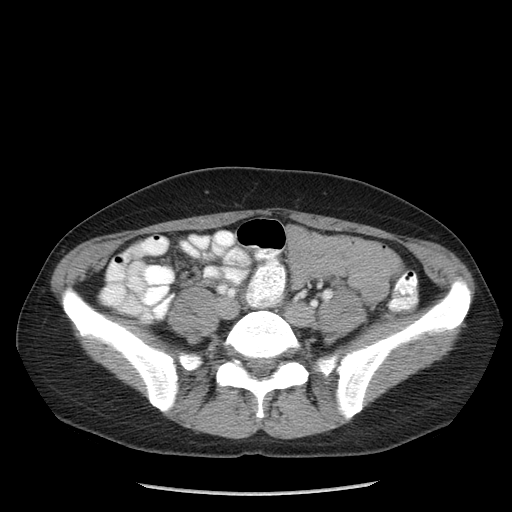
[im 42/119  soft-tissue]
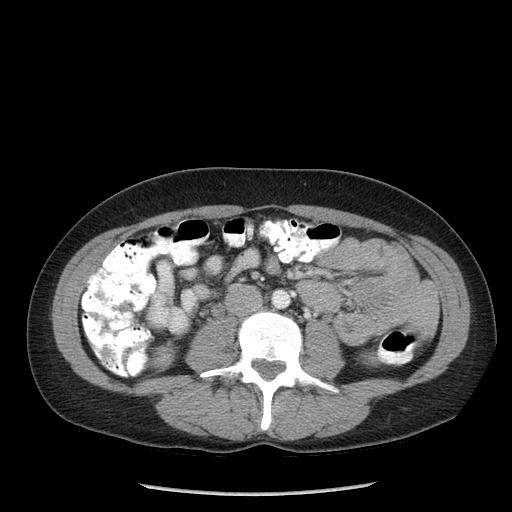
[im 56/119  soft-tissue]
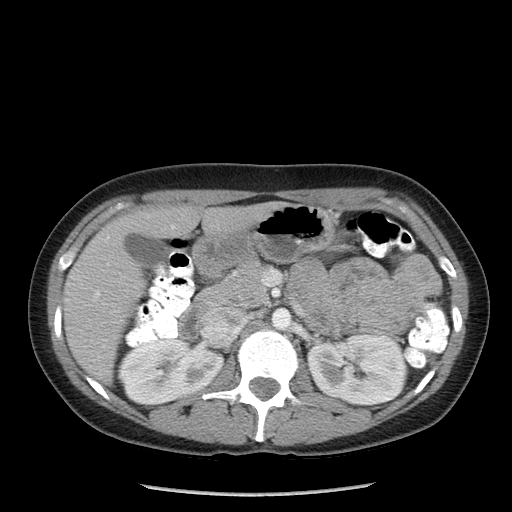
[im 70/119  soft-tissue]
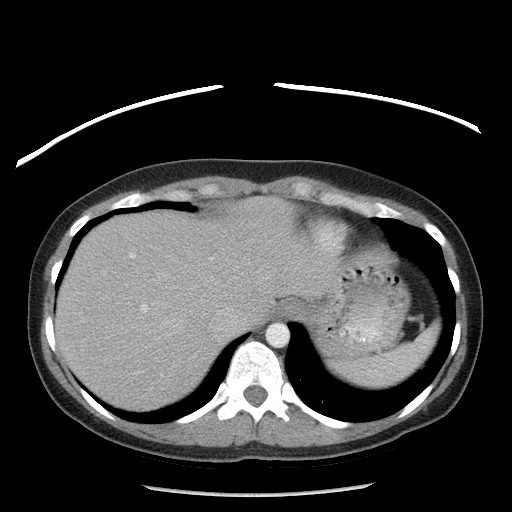

[Series 5: renal delay · axial · delayed · 0.70mm/px · z∈[-334,-264]mm · 3 of 30 slices shown, 7 images]
[im 8/30  soft-tissue]
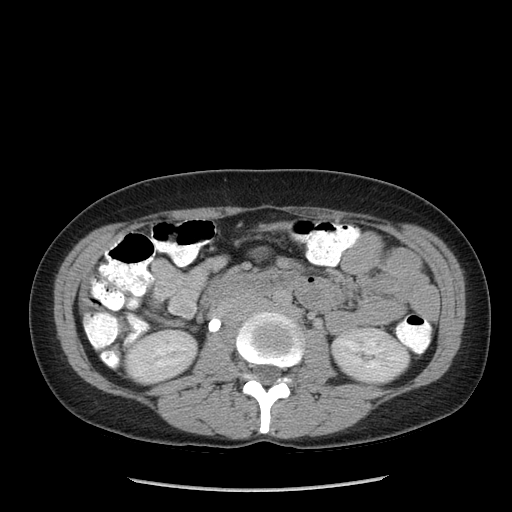
[im 8/30  lung]
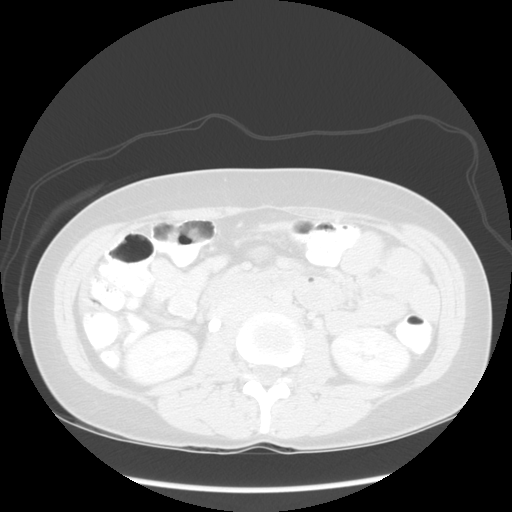
[im 8/30  bone]
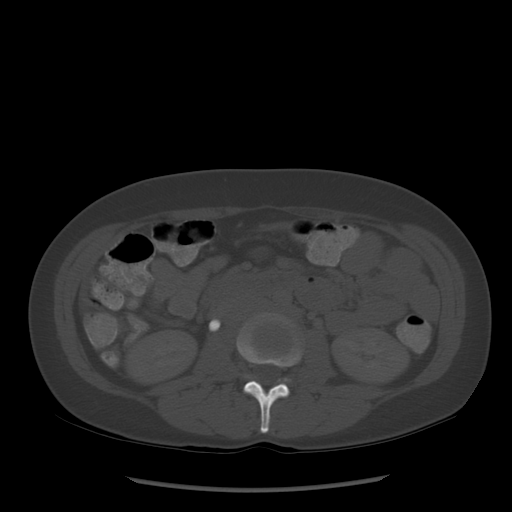
[im 15/30  soft-tissue]
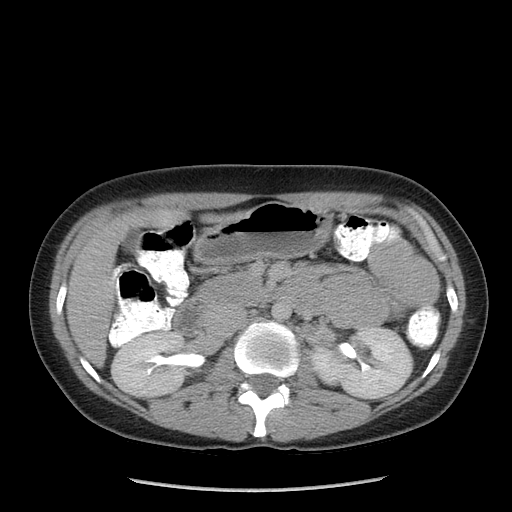
[im 15/30  lung]
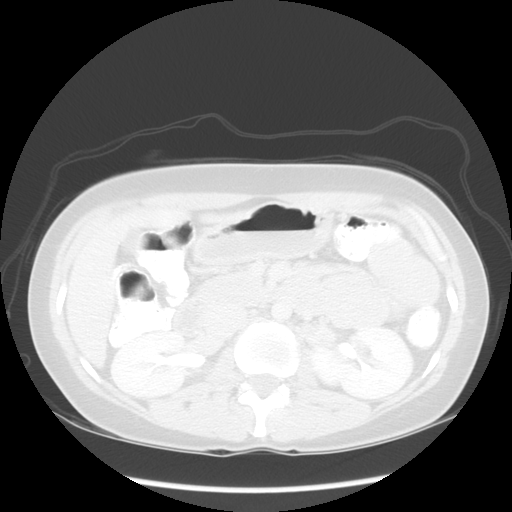
[im 22/30  soft-tissue]
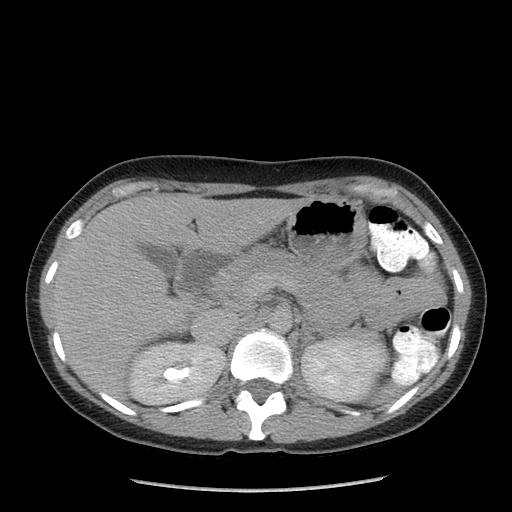
[im 22/30  lung]
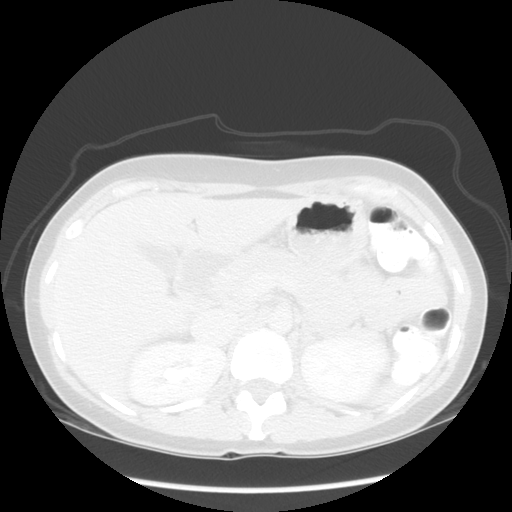

[Series 601: coronal body · coronal · 1.29mm/px · 1 of 126 slices shown, 2 images]
[im 42/126  soft-tissue]
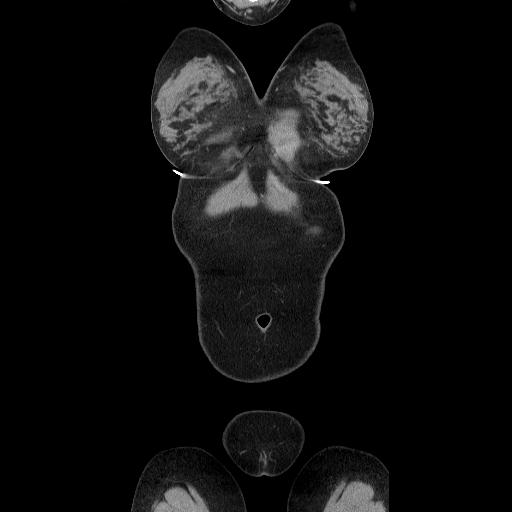
[im 42/126  bone]
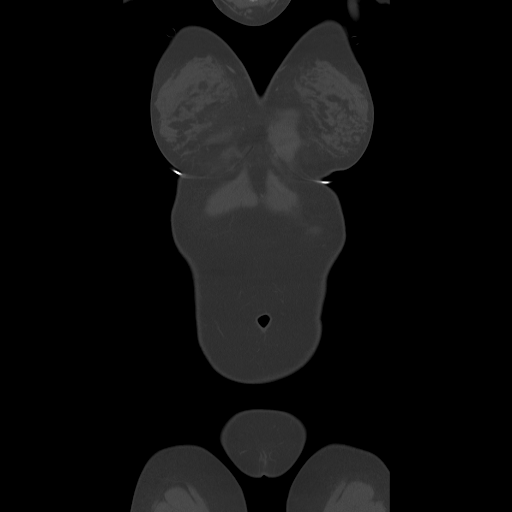

[Series 602: sagittal body · sagittal · 1.29mm/px · 3 of 145 slices shown, 4 images]
[im 49/145  soft-tissue]
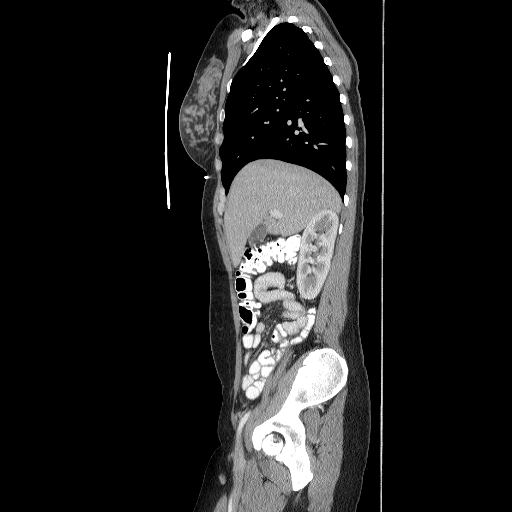
[im 65/145  soft-tissue]
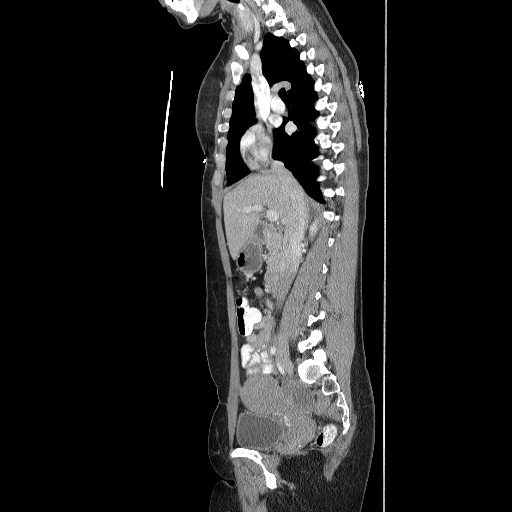
[im 65/145  bone]
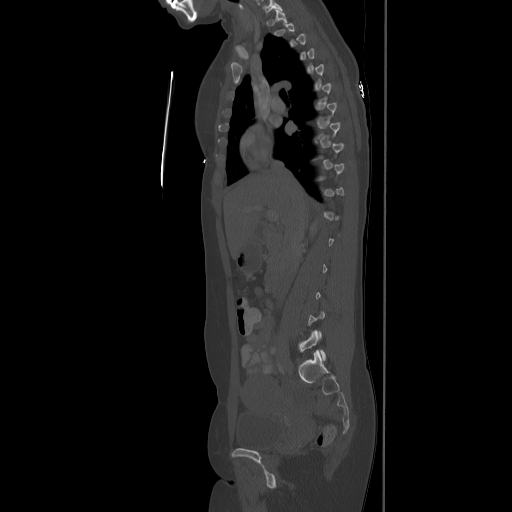
[im 81/145  soft-tissue]
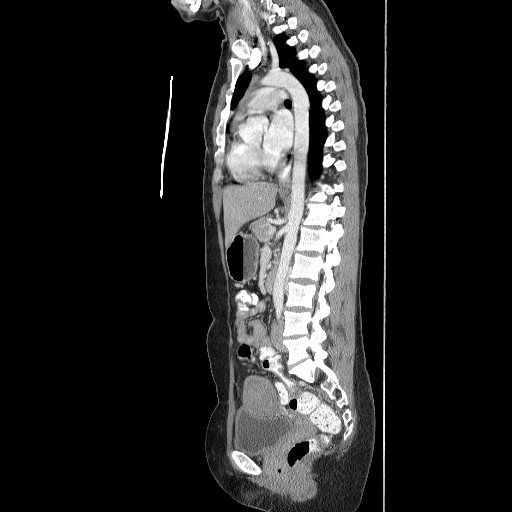

[12 of 46 positions shown; findings below may reference images not displayed]

FINDINGS: There may be a subcentimeter low density lesion in the posterior aspect of the right thyroid versus beam-hardening artifact.  Residual thymus is seen in the anterior mediastinum.  No pathologically enlarged mediastinal, hilar, axillary, or internal mammary lymphadenopathy.  Heart size normal.  No pericardial effusion.  
Lungs are clear.  No pleural fluid.  Airway unremarkable.
IMPRESSION: No evidence of metastatic disease.
ABDOMEN CT WITH CONTRAST:
FINDINGS: Liver, gallbladder, adrenal glands, kidneys, spleen, pancreas, stomach, and small bowel are unremarkable.  Retroaortic left renal vein is incidentally noted.  No pathologically enlarged lymph nodes.  No free fluid.
IMPRESSION: No evidence of metastatic disease in the abdomen.
PELVIS CT WITH CONTRAST:
FINDINGS: Bilobed, cystic lesion in the right adnexa measures 3.9 x 4.9 cm.  Uterus is visualized.  Patient is reportedly status post left oophorectomy although there is a structure resembling the left ovary in the left anatomic pelvis (image 105).  Colon and appendix unremarkable.  Ileocolic mesenteric lymph nodes are not enlarged by CT size criteria.   No worrisome lytic or sclerotic lesions.
IMPRESSION: 1.  No evidence of metastatic disease in the pelvis.
2.  Bilobed cystic lesion in the right adnexa presumably originates from the right ovary.  Follow-up ultrasound in six weeks is recommended to ensure resolution.
3.  Patient is reportedly status post left oophorectomy but there appears to be residual left ovarian tissue in the left adnexa.  This could also be evaluated with ultrasound, as clinically indicated.

## 2009-06-06 ENCOUNTER — Ambulatory Visit: Payer: Self-pay | Admitting: Oncology

## 2009-06-16 ENCOUNTER — Encounter: Payer: Self-pay | Admitting: Internal Medicine

## 2009-06-16 LAB — CBC WITH DIFFERENTIAL/PLATELET
BASO%: 0.3 % (ref 0.0–2.0)
EOS%: 1.4 % (ref 0.0–7.0)
LYMPH%: 48.8 % (ref 14.0–49.7)
MCHC: 33.9 g/dL (ref 31.5–36.0)
MCV: 91.3 fL (ref 79.5–101.0)
MONO%: 6.7 % (ref 0.0–14.0)
Platelets: 215 10*3/uL (ref 145–400)
RBC: 4.47 10*6/uL (ref 3.70–5.45)

## 2009-06-16 LAB — COMPREHENSIVE METABOLIC PANEL
ALT: 19 U/L (ref 0–35)
Alkaline Phosphatase: 70 U/L (ref 39–117)
Sodium: 139 mEq/L (ref 135–145)
Total Bilirubin: 0.7 mg/dL (ref 0.3–1.2)
Total Protein: 6.9 g/dL (ref 6.0–8.3)

## 2009-06-16 LAB — CANCER ANTIGEN 27.29: CA 27.29: 23 U/mL (ref 0–39)

## 2009-08-17 ENCOUNTER — Ambulatory Visit: Payer: Self-pay | Admitting: Family Medicine

## 2009-08-17 DIAGNOSIS — Z853 Personal history of malignant neoplasm of breast: Secondary | ICD-10-CM

## 2009-08-18 ENCOUNTER — Encounter: Payer: Self-pay | Admitting: Family Medicine

## 2009-10-13 ENCOUNTER — Ambulatory Visit: Payer: Self-pay | Admitting: Oncology

## 2009-10-17 LAB — COMPREHENSIVE METABOLIC PANEL
ALT: 14 U/L (ref 0–35)
AST: 17 U/L (ref 0–37)
Alkaline Phosphatase: 63 U/L (ref 39–117)
Creatinine, Ser: 0.83 mg/dL (ref 0.40–1.20)
Sodium: 139 mEq/L (ref 135–145)
Total Bilirubin: 0.6 mg/dL (ref 0.3–1.2)
Total Protein: 7.1 g/dL (ref 6.0–8.3)

## 2009-10-17 LAB — CBC WITH DIFFERENTIAL/PLATELET
BASO%: 0.5 % (ref 0.0–2.0)
EOS%: 1.1 % (ref 0.0–7.0)
HCT: 40.7 % (ref 34.8–46.6)
LYMPH%: 52.8 % — ABNORMAL HIGH (ref 14.0–49.7)
MCH: 32.5 pg (ref 25.1–34.0)
MCHC: 34.9 g/dL (ref 31.5–36.0)
NEUT%: 39.7 % (ref 38.4–76.8)
Platelets: 204 10*3/uL (ref 145–400)
RBC: 4.37 10*6/uL (ref 3.70–5.45)
WBC: 5.7 10*3/uL (ref 3.9–10.3)
lymph#: 3 10*3/uL (ref 0.9–3.3)

## 2009-10-19 ENCOUNTER — Encounter: Payer: Self-pay | Admitting: Internal Medicine

## 2009-12-06 ENCOUNTER — Observation Stay (HOSPITAL_COMMUNITY): Admission: EM | Admit: 2009-12-06 | Discharge: 2009-12-07 | Payer: Self-pay | Admitting: Emergency Medicine

## 2009-12-06 ENCOUNTER — Encounter (INDEPENDENT_AMBULATORY_CARE_PROVIDER_SITE_OTHER): Payer: Self-pay | Admitting: Surgery

## 2009-12-29 ENCOUNTER — Encounter: Payer: Self-pay | Admitting: Family Medicine

## 2010-01-02 ENCOUNTER — Encounter (INDEPENDENT_AMBULATORY_CARE_PROVIDER_SITE_OTHER): Payer: Self-pay | Admitting: Obstetrics and Gynecology

## 2010-01-02 ENCOUNTER — Inpatient Hospital Stay (HOSPITAL_COMMUNITY): Admission: RE | Admit: 2010-01-02 | Discharge: 2010-01-04 | Payer: Self-pay | Admitting: Obstetrics and Gynecology

## 2010-01-03 ENCOUNTER — Encounter (INDEPENDENT_AMBULATORY_CARE_PROVIDER_SITE_OTHER): Payer: Self-pay | Admitting: Obstetrics and Gynecology

## 2010-02-09 ENCOUNTER — Ambulatory Visit: Payer: Self-pay | Admitting: Oncology

## 2010-02-13 ENCOUNTER — Encounter: Payer: Self-pay | Admitting: Internal Medicine

## 2010-02-13 LAB — COMPREHENSIVE METABOLIC PANEL
ALT: 22 U/L (ref 0–35)
CO2: 27 mEq/L (ref 19–32)
Chloride: 105 mEq/L (ref 96–112)
Creatinine, Ser: 0.84 mg/dL (ref 0.40–1.20)
Potassium: 3.8 mEq/L (ref 3.5–5.3)
Sodium: 140 mEq/L (ref 135–145)
Total Bilirubin: 0.6 mg/dL (ref 0.3–1.2)
Total Protein: 6.5 g/dL (ref 6.0–8.3)

## 2010-02-13 LAB — CBC WITH DIFFERENTIAL/PLATELET
EOS%: 2 % (ref 0.0–7.0)
HCT: 36.5 % (ref 34.8–46.6)
HGB: 12.4 g/dL (ref 11.6–15.9)
MCH: 31.3 pg (ref 25.1–34.0)
MCHC: 34 g/dL (ref 31.5–36.0)
MCV: 92.2 fL (ref 79.5–101.0)
MONO#: 0.4 10*3/uL (ref 0.1–0.9)
NEUT%: 37.4 % — ABNORMAL LOW (ref 38.4–76.8)
Platelets: 198 10*3/uL (ref 145–400)
RBC: 3.96 10*6/uL (ref 3.70–5.45)
RDW: 12.2 % (ref 11.2–14.5)

## 2010-02-13 LAB — LACTATE DEHYDROGENASE: LDH: 132 U/L (ref 94–250)

## 2010-02-14 LAB — CANCER ANTIGEN 27.29: CA 27.29: 25 U/mL (ref 0–39)

## 2010-04-11 ENCOUNTER — Ambulatory Visit: Payer: Self-pay | Admitting: Family Medicine

## 2010-04-13 ENCOUNTER — Telehealth (INDEPENDENT_AMBULATORY_CARE_PROVIDER_SITE_OTHER): Payer: Self-pay | Admitting: *Deleted

## 2010-04-22 ENCOUNTER — Ambulatory Visit: Payer: Self-pay | Admitting: Emergency Medicine

## 2010-04-22 LAB — CONVERTED CEMR LAB
Bilirubin Urine: NEGATIVE
Nitrite: NEGATIVE
Protein, U semiquant: NEGATIVE
Urobilinogen, UA: 0.2

## 2010-04-24 ENCOUNTER — Encounter: Payer: Self-pay | Admitting: Emergency Medicine

## 2010-04-25 ENCOUNTER — Telehealth (INDEPENDENT_AMBULATORY_CARE_PROVIDER_SITE_OTHER): Payer: Self-pay | Admitting: *Deleted

## 2010-05-31 ENCOUNTER — Ambulatory Visit: Payer: Self-pay | Admitting: Family Medicine

## 2010-06-13 ENCOUNTER — Ambulatory Visit: Payer: Self-pay | Admitting: Oncology

## 2010-06-15 LAB — CBC WITH DIFFERENTIAL/PLATELET
Basophils Absolute: 0 10*3/uL (ref 0.0–0.1)
Eosinophils Absolute: 0.1 10*3/uL (ref 0.0–0.5)
HCT: 38.7 % (ref 34.8–46.6)
HGB: 13.4 g/dL (ref 11.6–15.9)
LYMPH%: 48.8 % (ref 14.0–49.7)
MCV: 90.4 fL (ref 79.5–101.0)
MONO#: 0.4 10*3/uL (ref 0.1–0.9)
NEUT#: 2.3 10*3/uL (ref 1.5–6.5)
NEUT%: 41.9 % (ref 38.4–76.8)
Platelets: 235 10*3/uL (ref 145–400)
RBC: 4.28 10*6/uL (ref 3.70–5.45)
WBC: 5.4 10*3/uL (ref 3.9–10.3)
nRBC: 0 % (ref 0–0)

## 2010-06-15 LAB — COMPREHENSIVE METABOLIC PANEL
ALT: 19 U/L (ref 0–35)
CO2: 27 mEq/L (ref 19–32)
Calcium: 9 mg/dL (ref 8.4–10.5)
Chloride: 101 mEq/L (ref 96–112)
Creatinine, Ser: 0.98 mg/dL (ref 0.40–1.20)
Glucose, Bld: 88 mg/dL (ref 70–99)

## 2010-06-15 LAB — CANCER ANTIGEN 27.29: CA 27.29: 28 U/mL (ref 0–39)

## 2010-06-15 LAB — LACTATE DEHYDROGENASE: LDH: 130 U/L (ref 94–250)

## 2010-06-26 ENCOUNTER — Encounter: Admission: RE | Admit: 2010-06-26 | Discharge: 2010-06-26 | Payer: Self-pay | Admitting: Family Medicine

## 2010-06-26 ENCOUNTER — Ambulatory Visit: Payer: Self-pay | Admitting: Family Medicine

## 2010-06-26 DIAGNOSIS — M549 Dorsalgia, unspecified: Secondary | ICD-10-CM | POA: Insufficient documentation

## 2010-06-28 ENCOUNTER — Encounter
Admission: RE | Admit: 2010-06-28 | Discharge: 2010-07-27 | Payer: Self-pay | Source: Home / Self Care | Attending: Family Medicine | Admitting: Family Medicine

## 2010-08-07 ENCOUNTER — Encounter: Payer: Self-pay | Admitting: Family Medicine

## 2010-08-16 ENCOUNTER — Ambulatory Visit: Admit: 2010-08-16 | Payer: Self-pay | Admitting: Family Medicine

## 2010-08-20 ENCOUNTER — Encounter: Payer: Self-pay | Admitting: General Surgery

## 2010-08-31 NOTE — Assessment & Plan Note (Signed)
Summary: COLD/TM   Vital Signs:  Patient Profile:   45 Years Old Female CC:      Cold & URI symptoms Height:     67.5 inches Weight:      175 pounds O2 Sat:      96 % O2 treatment:    Room Air Temp:     98.7 degrees F oral Pulse rate:   79 / minute Pulse rhythm:   regular Resp:     16 per minute BP sitting:   115 / 72  (right arm) Cuff size:   regular  Vitals Entered By: Areta Haber CMA (August 17, 2009 5:20 PM)                  Prior Medication List:  No prior medications documented  Current Allergies (reviewed today): ! MORPHINE   History of Present Illness Chief Complaint: Cold & URI symptoms History of Present Illness: Patient has been sick since Monday. Patient is leaving Friday on a flight. She feels hoarse, scratchy throat, and congested. She has also had a dry cough as well.   Current Problems: ACUTE SEROUS OTITIS MEDIA (ICD-381.01) ACUTE SINUSITIS, UNSPECIFIED (ICD-461.9) FAMILY HISTORY DIABETES 1ST DEGREE RELATIVE (ICD-V18.0) BREAST CANCER, HX OF (ICD-V10.3)   Current Meds TAMOXIFEN CITRATE 10 MG TABS (TAMOXIFEN CITRATE) 1 tab by mouth once daily DAILY VITAMINS FOR WOMEN  TABS (MULTIPLE VITAMINS-CALCIUM) 1 tab by mouth once daily VITAMIN D 1000 UNIT TABS (CHOLECALCIFEROL) 1 tab by mouth once daily CALCIUM CARBONATE 1500 MG TABS (CALCIUM CARBONATE) 2 tabs by mouth once daily ADVIL 200 MG TABS (IBUPROFEN) as needed ZITHROMAX Z-PAK 250 MG  TABS (AZITHROMYCIN) Use as directed * NASONEX 50 MCG/ACT SUSP/OR FLONASE 1-2 each nostril qd * CLARITIN-D 24 HOUR 10-240 MG / OR ALLEGRA -D 24H-TAB 1 by mouth Q DAY  REVIEW OF SYSTEMS Constitutional Symptoms       Complains of fatigue.     Denies fever, chills, night sweats, weight loss, and weight gain.  Eyes       Denies change in vision, eye pain, eye discharge, glasses, contact lenses, and eye surgery. Ear/Nose/Throat/Mouth       Complains of sore throat and hoarseness.      Denies hearing loss/aids,  change in hearing, ear pain, ear discharge, dizziness, frequent runny nose, frequent nose bleeds, sinus problems, and tooth pain or bleeding.  Respiratory       Complains of dry cough.      Denies wheezing, shortness of breath, asthma, bronchitis, and emphysema/COPD.  Cardiovascular       Denies murmurs, chest pain, and tires easily with exhertion.    Gastrointestinal       Complains of nausea/vomiting.      Denies stomach pain, diarrhea, constipation, blood in bowel movements, and indigestion. Genitourniary       Denies painful urination, kidney stones, and loss of urinary control. Neurological       Denies paralysis, seizures, and fainting/blackouts. Musculoskeletal       Denies muscle pain, joint pain, joint stiffness, decreased range of motion, redness, swelling, muscle weakness, and gout.  Skin       Denies bruising, unusual mles/lumps or sores, and hair/skin or nail changes.  Psych       Denies mood changes, temper/anger issues, anxiety/stress, speech problems, depression, and sleep problems.  Past History:  Past Medical History: Breast cancer, hx of DX 2/09  Past Surgical History: Caesarean section Mastectomy 08/09  Family History: Family History Diabetes 1st degree  relative Family History of Prostate CA 1st degree relative <50 Family History of Cardiovascular disorder  Social History: Married Never Smoked Alcohol use-no Drug use-no Regular exercise-yes Smoking Status:  never Drug Use:  no Does Patient Exercise:  yes Physical Exam General appearance: well developed, well nourished, mild distress Head: normocephalic, atraumatic, anterior cervical lymphadenopathy Eyes: allergic shiners present Ears: fluid noted without inflammation bilateral TM Nasal: pale, boggy, swollen nasal turbinates Neck: supple,anterior lymphadenopathy present Chest/Lungs: no rales, wheezes, or rhonchi bilateral, breath sounds equal without effort Skin: no obvious rashes or lesions MSE:  oriented to time, place, and person Assessment Problems:   ACUTE SEROUS OTITIS MEDIA (ICD-381.01) ACUTE SINUSITIS, UNSPECIFIED (ICD-461.9) FAMILY HISTORY DIABETES 1ST DEGREE RELATIVE (ICD-V18.0) BREAST CANCER, HX OF (ICD-V10.3) New Problems: ACUTE SEROUS OTITIS MEDIA (ICD-381.01) ACUTE SINUSITIS, UNSPECIFIED (ICD-461.9) FAMILY HISTORY DIABETES 1ST DEGREE RELATIVE (ICD-V18.0) BREAST CANCER, HX OF (ICD-V10.3)  SINUSITIS  SEROUS OTIITIS  Patient Education: Patient and/or caregiver instructed in the following: rest fluids and Tylenol.  Plan New Medications/Changes: CLARITIN-D 24 HOUR 10-240 MG / OR ALLEGRA -D 24H-TAB 1 by mouth Q DAY  #20 x 0, 08/17/2009, Hassan Rowan MD NASONEX 50 MCG/ACT SUSP/OR FLONASE 1-2 each nostril qd  #1 x 0, 08/17/2009, Hassan Rowan MD ZITHROMAX Z-PAK 250 MG  TABS (AZITHROMYCIN) Use as directed  #1 x 0, 08/17/2009, Hassan Rowan MD  New Orders: New Patient Level III [40981] Rapid Strep [87880] T-Culture, Throat [[id number]] Planning Comments:   MAY EXPERIENCE EAR DISCOMFORT W/FLIGHT ON FRIDAY  Follow Up: Follow up in 2-3 days if no improvement, Follow up on an as needed basis, Follow up with Primary Physician  The patient and/or caregiver has been counseled thoroughly with regard to medications prescribed including dosage, schedule, interactions, rationale for use, and possible side effects and they verbalize understanding.  Diagnoses and expected course of recovery discussed and will return if not improved as expected or if the condition worsens. Patient and/or caregiver verbalized understanding.  Prescriptions: CLARITIN-D 24 HOUR 10-240 MG / OR ALLEGRA -D 24H-TAB 1 by mouth Q DAY  #20 x 0   Entered and Authorized by:   Hassan Rowan MD   Signed by:   Hassan Rowan MD on 08/17/2009   Method used:   Print then Give to Patient   RxID:   [id number] NASONEX 50 MCG/ACT SUSP/OR FLONASE 1-2 each nostril qd  #1 x 0   Entered and Authorized by:   Hassan Rowan  MD   Signed by:   Hassan Rowan MD on 08/17/2009   Method used:   Print then Give to Patient   RxID:   [id number] Z-PAK 250 MG  TABS (AZITHROMYCIN) Use as directed  #1 x 0   Entered and Authorized by:   Hassan Rowan MD   Signed by:   Hassan Rowan MD on 08/17/2009   Method used:   Print then Give to Patient   RxID:   [id number]   Patient Instructions: 1)  Take your antibiotic as prescribed until ALL of it is gone, but stop if you develop a rash or swelling and contact our office as soon as possible. 2)  Acute sinusitis symptoms for less than 10 days are not helped by antibiotics.Use warm moist compresses, and over the counter decongestants ( only as directed). Call if no improvement in 5-7 days, sooner if increasing pain, fever, or new symptoms. 3)  Please schedule an appointment with your primary doctor in :  Laboratory Results  Date/Time Received: August 17, 2009 6:34 PM  Date/Time Reported: August 17, 2009 6:34 PM   Other Tests  Rapid Strep: negative  Kit Test Internal QC: Negative   (Normal Range: Negative)

## 2010-08-31 NOTE — Letter (Signed)
[facility]   Imported By: Lanelle Bal 03/08/2010 11:36:58  _____________________________________________________________________  External Attachment:    Type:   Image     Comment:   External Document

## 2010-08-31 NOTE — Assessment & Plan Note (Signed)
Summary: HEADACHE,EAR ACHE,EYE PAIN/TJ (rm 4)   Vital Signs:  Patient Profile:   45 Years Old Female CC:      HA, ear pressure, eye pressure Height:     67.5 inches Weight:      167.50 pounds O2 Sat:      99 % O2 treatment:    Room Air Temp:     98.0 degrees F oral Pulse rate:   69 / minute Resp:     14 per minute BP sitting:   109 / 73  (left arm) Cuff size:   regular  Pt. in pain?   yes    Location:   head    Intensity:   3    Type:       aching  Vitals Entered By: Lajean Saver RN (May 31, 2010 4:54 PM)                   Updated Prior Medication List: DAILY VITAMINS FOR WOMEN  TABS (MULTIPLE VITAMINS-CALCIUM) 1 tab by mouth once daily VITAMIN D 1000 UNIT TABS (CHOLECALCIFEROL) 1 tab by mouth once daily CALCIUM CARBONATE 1500 MG TABS (CALCIUM CARBONATE) 2 tabs by mouth once daily ADVIL 200 MG TABS (IBUPROFEN) as needed  Current Allergies (reviewed today): ! MORPHINEHistory of Present Illness Chief Complaint: HA, ear pressure, eye pressure History of Present Illness:  Subjective: Patient complains of vague sensation of pressure in both ears for 2 days, and her right eye and cheek has felt slightly swollen.  Her husband and son have had a viral URI over the past two weeks. No sore throat. No cough No pleuritic pain No wheezing Minimal nasal congestion No post-nasal drainage No itchy/red eyes No earache No hemoptysis No SOB No fever/chills No nausea No vomiting No abdominal pain No diarrhea No skin rashes + mild fatigue ? myalgias shoulders      REVIEW OF SYSTEMS Constitutional Symptoms      Denies fever, chills, night sweats, weight loss, weight gain, and fatigue.  Eyes       Complains of eye pain.      Denies change in vision, eye discharge, glasses, contact lenses, and eye surgery.      Comments: pressure Ear/Nose/Throat/Mouth       Complains of ear pain.      Denies hearing loss/aids, change in hearing, ear discharge, dizziness, frequent  runny nose, frequent nose bleeds, sinus problems, sore throat, hoarseness, and tooth pain or bleeding.      Comments: bilateral ear pressure Respiratory       Denies dry cough, productive cough, wheezing, shortness of breath, asthma, bronchitis, and emphysema/COPD.  Cardiovascular       Denies murmurs, chest pain, and tires easily with exhertion.    Gastrointestinal       Denies stomach pain, nausea/vomiting, diarrhea, constipation, blood in bowel movements, and indigestion. Genitourniary       Denies painful urination, kidney stones, and loss of urinary control. Neurological       Complains of headaches.      Denies paralysis, seizures, and fainting/blackouts. Musculoskeletal       Denies muscle pain, joint pain, joint stiffness, decreased range of motion, redness, swelling, muscle weakness, and gout.  Skin       Denies bruising, unusual mles/lumps or sores, and hair/skin or nail changes.  Psych       Denies mood changes, temper/anger issues, anxiety/stress, speech problems, depression, and sleep problems.  Past History:  Past Medical History: Breast cancer,  hx of- bilateral DX 2/09  Past Surgical History: Caesarean section Mastectomy 08/09 Hysterectomy Breast Implants  Family History: Reviewed history from 08/17/2009 and no changes required. Family History Diabetes 1st degree relative Family History of Prostate CA 1st degree relative <50 Family History of Cardiovascular disorder  Social History: Reviewed history from 08/17/2009 and no changes required. Married Never Smoked Alcohol use-no Drug use-no Regular exercise-yes   Objective:  Appearance:  Patient appears healthy, stated age, and in no acute distress  Eyes:  Pupils are equal, round, and reactive to light and accomdation.  Extraocular movement is intact.  Conjunctivae are not inflamed.  Ears:  Canals normal.  Tympanic membranes normal.  Mild TMJ tenderness Nose:  Normal septum.  Normal turbinates, mildly  congested.    No sinus tenderness present.  Pharynx:  Normal  Neck:  Supple.  No adenopathy is present.  No thyromegaly is present  Lungs:  Clear to auscultation.  Breath sounds are equal.  Heart:  Regular rate and rhythm without murmurs, rubs, or gallops.   Assessment New Problems: EUSTACHIAN TUBE DYSFUNCTION, BILATERAL (ICD-381.81)  SUSPECT EARLY VIRAL URI  Plan New Orders: Est. Patient Level III [54098] Planning Comments:   Treat symptomatically for now:  Mucinex and topical decongestant, saline nasal spray, increased fluid intake.  If develops cough, may take Delsym cough suppressant at bedtime. Follow-up with PCP if not improving.   The patient and/or caregiver has been counseled thoroughly with regard to medications prescribed including dosage, schedule, interactions, rationale for use, and possible side effects and they verbalize understanding.  Diagnoses and expected course of recovery discussed and will return if not improved as expected or if the condition worsens. Patient and/or caregiver verbalized understanding.   Orders Added: 1)  Est. Patient Level III [11914]

## 2010-08-31 NOTE — Assessment & Plan Note (Signed)
Summary: Frequent, painful urination x 1 wk rm 5   Vital Signs:  Patient Profile:   45 Years Old Female CC:      Frequent, painful urination x 1 wk Height:     67.5 inches Weight:      188 pounds O2 Sat:      100 % O2 treatment:    Room Air Temp:     98.5 degrees F oral Pulse rate:   84 / minute Pulse rhythm:   regular Resp:     16 per minute BP sitting:   113 / 76  (left arm) Cuff size:   regular  Vitals Entered By: Areta Haber CMA (April 22, 2010 3:21 PM)                  Current Allergies: ! MORPHINE       History of Present Illness History from: patient Chief Complaint: Frequent, painful urination x 1 wk History of Present Illness: Patient complains of UTI symptoms for 5-6 days.  She describes the pain as burning during urination.  She has tried leftover Amox which hasn't helped.  Azo did help. + dysuria + frequency No urgency No hematuria No vaginal discharge No fever/chills No lower abdomenal pain No back pain  + fatigue  Current Problems: UTI (ICD-599.0) VIRAL URI (ICD-465.9) ACUTE SEROUS OTITIS MEDIA (ICD-381.01) ACUTE SINUSITIS, UNSPECIFIED (ICD-461.9) FAMILY HISTORY DIABETES 1ST DEGREE RELATIVE (ICD-V18.0) BREAST CANCER, HX OF (ICD-V10.3)   Current Meds DAILY VITAMINS FOR WOMEN  TABS (MULTIPLE VITAMINS-CALCIUM) 1 tab by mouth once daily VITAMIN D 1000 UNIT TABS (CHOLECALCIFEROL) 1 tab by mouth once daily CALCIUM CARBONATE 1500 MG TABS (CALCIUM CARBONATE) 2 tabs by mouth once daily ADVIL 200 MG TABS (IBUPROFEN) as needed AMOXICILLIN 875 MG TABS (AMOXICILLIN) One by mouth two times a day (Rx void after 04/18/10) BENZONATATE 200 MG CAPS (BENZONATATE) One by mouth hs as needed cough CIPROFLOXACIN HCL 250 MG TABS (CIPROFLOXACIN HCL) 1 tab by mouth two times a day for 5 days PHENAZOPYRIDINE HCL 200 MG TABS (PHENAZOPYRIDINE HCL) 1 tab by mouth three times a day for 2 days  REVIEW OF SYSTEMS Constitutional Symptoms      Denies fever,  chills, night sweats, weight loss, weight gain, and fatigue.  Eyes       Denies change in vision, eye pain, eye discharge, glasses, contact lenses, and eye surgery. Ear/Nose/Throat/Mouth       Denies hearing loss/aids, change in hearing, ear pain, ear discharge, dizziness, frequent runny nose, frequent nose bleeds, sinus problems, sore throat, hoarseness, and tooth pain or bleeding.  Respiratory       Denies dry cough, productive cough, wheezing, shortness of breath, asthma, bronchitis, and emphysema/COPD.  Cardiovascular       Denies murmurs, chest pain, and tires easily with exhertion.    Gastrointestinal       Denies stomach pain, nausea/vomiting, diarrhea, constipation, blood in bowel movements, and indigestion. Genitourniary       Complains of painful urination.      Denies kidney stones and loss of urinary control.      Comments: Frequent x 1 wk Neurological       Denies paralysis, seizures, and fainting/blackouts. Musculoskeletal       Denies muscle pain, joint pain, joint stiffness, decreased range of motion, redness, swelling, muscle weakness, and gout.  Skin       Denies bruising, unusual mles/lumps or sores, and hair/skin or nail changes.  Psych       Denies  mood changes, temper/anger issues, anxiety/stress, speech problems, depression, and sleep problems. Other Comments: Pt has not seen PCP for this.   Past History:  Past Medical History: Last updated: 08/17/2009 Breast cancer, hx of DX 2/09  Family History: Last updated: 08/17/2009 Family History Diabetes 1st degree relative Family History of Prostate CA 1st degree relative <50 Family History of Cardiovascular disorder  Social History: Last updated: 08/17/2009 Married Never Smoked Alcohol use-no Drug use-no Regular exercise-yes  Risk Factors: Exercise: yes (08/17/2009)  Risk Factors: Smoking Status: never (08/17/2009)  Past Surgical History: Caesarean section Mastectomy  08/09 Hysterectomy  Allergies: 1)  ! Morphine  Physical Exam General appearance: well developed, well nourished, no acute distress Chest/Lungs: no rales, wheezes, or rhonchi bilateral, breath sounds equal without effort Heart: regular rate and  rhythm, no murmur Abdomen: soft, non-tender without obvious organomegaly Skin: no obvious rashes or lesions MSE: oriented to time, place, and person Assessment New Problems: UTI (ICD-599.0)   Patient Education: Patient and/or caregiver instructed in the following: rest, fluids, Ibuprofen prn.  Plan New Medications/Changes: PHENAZOPYRIDINE HCL 200 MG TABS (PHENAZOPYRIDINE HCL) 1 tab by mouth three times a day for 2 days  #6 x 0, 04/22/2010, Hoyt Koch MD CIPROFLOXACIN HCL 250 MG TABS (CIPROFLOXACIN HCL) 1 tab by mouth two times a day for 5 days  #10 x 0, 04/22/2010, Hoyt Koch MD  New Orders: Est. Patient Level III [13244] UA Dipstick w/o Micro (automated)  [81003] T-Culture, Urine [[id number]] Planning Comments:   Hydration Wipe front to back Follow-up with your primary care physician if not improving or if getting worse  Culture is pending   The patient and/or caregiver has been counseled thoroughly with regard to medications prescribed including dosage, schedule, interactions, rationale for use, and possible side effects and they verbalize understanding.  Diagnoses and expected course of recovery discussed and will return if not improved as expected or if the condition worsens. Patient and/or caregiver verbalized understanding.  Prescriptions: PHENAZOPYRIDINE HCL 200 MG TABS (PHENAZOPYRIDINE HCL) 1 tab by mouth three times a day for 2 days  #6 x 0   Entered and Authorized by:   Hoyt Koch MD   Signed by:   Hoyt Koch MD on 04/22/2010   Method used:   Print then Give to Patient   RxID:   [id number] CIPROFLOXACIN HCL 250 MG TABS (CIPROFLOXACIN HCL) 1 tab by mouth two times a day for 5 days  #10 x  0   Entered and Authorized by:   Hoyt Koch MD   Signed by:   Hoyt Koch MD on 04/22/2010   Method used:   Print then Give to Patient   RxID:   [id number]   Orders Added: 1)  Est. Patient Level III [30160] 2)  UA Dipstick w/o Micro (automated)  [81003] 3)  T-Culture, Urine [[id number]]  Laboratory Results   Urine Tests  Date/Time Received: April 22, 2010 3:25 PM  Date/Time Reported: April 22, 2010 3:25 PM   Routine Urinalysis   Color: yellow Appearance: Cloudy Glucose: negative   (Normal Range: Negative) Bilirubin: negative   (Normal Range: Negative) Ketone: negative   (Normal Range: Negative) Spec. Gravity: >=1.030   (Normal Range: 1.003-1.035) Blood: negative   (Normal Range: Negative) pH: 5.5   (Normal Range: 5.0-8.0) Protein: negative   (Normal Range: Negative) Urobilinogen: 0.2   (Normal Range: 0-1) Nitrite: negative   (Normal Range: Negative) Leukocyte Esterace: negative   (Normal Range: Negative)

## 2010-08-31 NOTE — Assessment & Plan Note (Signed)
Summary: HEAD COLD/COUGH/NH Room 4   Vital Signs:  Patient Profile:   45 Years Old Female CC:      Cough congestion, sneezing x 1 wk Height:     67.5 inches Weight:      169 pounds O2 Sat:      95 % O2 treatment:    Room Air Temp:     98.5 degrees F oral Pulse rate:   75 / minute Pulse rhythm:   regular Resp:     16 per minute BP sitting:   100 / 67  (left arm) Cuff size:   regular  Vitals Entered By: Emilio Math (April 11, 2010 10:42 AM)                  Current Allergies (reviewed today): ! MORPHINEHistory of Present Illness Chief Complaint: Cough congestion, sneezing x 1 wk History of Present Illness:  Subjective: Patient complains onset of URI symptoms about 9 days ago with a mild sore throat that lasted only one day.  She then developed sinus congestion.  She developed a cough 2 days ago.   No pleuritic pain No wheezing No post-nasal drainage No sinus pain/pressure No itchy/red eyes No earache No hemoptysis No SOB No fever/chills No nausea No vomiting No abdominal pain No diarrhea No skin rashes + fatigue initially No myalgias No headache Used OTC meds without relief   Current Meds DAILY VITAMINS FOR WOMEN  TABS (MULTIPLE VITAMINS-CALCIUM) 1 tab by mouth once daily VITAMIN D 1000 UNIT TABS (CHOLECALCIFEROL) 1 tab by mouth once daily CALCIUM CARBONATE 1500 MG TABS (CALCIUM CARBONATE) 2 tabs by mouth once daily ADVIL 200 MG TABS (IBUPROFEN) as needed AMOXICILLIN 875 MG TABS (AMOXICILLIN) One by mouth two times a day (Rx void after 04/18/10) BENZONATATE 200 MG CAPS (BENZONATATE) One by mouth hs as needed cough  REVIEW OF SYSTEMS Constitutional Symptoms      Denies fever, chills, night sweats, weight loss, weight gain, and fatigue.  Eyes       Denies change in vision, eye pain, eye discharge, glasses, contact lenses, and eye surgery. Ear/Nose/Throat/Mouth       Complains of ear pain and sinus problems.      Denies hearing loss/aids, change in  hearing, ear discharge, dizziness, frequent runny nose, frequent nose bleeds, sore throat, hoarseness, and tooth pain or bleeding.  Respiratory       Complains of dry cough.      Denies productive cough, wheezing, shortness of breath, asthma, bronchitis, and emphysema/COPD.  Cardiovascular       Denies murmurs, chest pain, and tires easily with exhertion.    Gastrointestinal       Denies stomach pain, nausea/vomiting, diarrhea, constipation, blood in bowel movements, and indigestion. Genitourniary       Denies painful urination, kidney stones, and loss of urinary control. Neurological       Denies paralysis, seizures, and fainting/blackouts. Musculoskeletal       Denies muscle pain, joint pain, joint stiffness, decreased range of motion, redness, swelling, muscle weakness, and gout.  Skin       Denies bruising, unusual mles/lumps or sores, and hair/skin or nail changes.  Psych       Denies mood changes, temper/anger issues, anxiety/stress, speech problems, depression, and sleep problems.  Past History:  Past Medical History: Reviewed history from 08/17/2009 and no changes required. Breast cancer, hx of DX 2/09  Past Surgical History: Reviewed history from 08/17/2009 and no changes required. Caesarean section Mastectomy 08/09  Family History: Reviewed history from 08/17/2009 and no changes required. Family History Diabetes 1st degree relative Family History of Prostate CA 1st degree relative <50 Family History of Cardiovascular disorder  Social History: Reviewed history from 08/17/2009 and no changes required. Married Never Smoked Alcohol use-no Drug use-no Regular exercise-yes   Objective:  Appearance:  Patient appears healthy, stated age, and in no acute distress  Eyes:  Pupils are equal, round, and reactive to light and accomdation.  Extraocular movement is intact.  Conjunctivae are not inflamed.  Ears:  Canals normal.  Tympanic membranes normal.   Nose:  Normal  septum.  Normal turbinates, mildly congested.   No sinus tenderness present. Pharynx:  Normal  Neck:  Supple.  Slightly tender shotty anterior/posterior nodes are palpated bilaterally.  Lungs:  Clear to auscultation.  Breath sounds are equal.  Heart:  Regular rate and rhythm without murmurs, rubs, or gallops.  Abdomen:  Nontender without masses or hepatosplenomegaly.  Bowel sounds are present.  No CVA or flank tenderness.  Extremities:  No edema.   Skin:  no rash Assessment New Problems: VIRAL URI (ICD-465.9)  NO EVIDENCE OF BACTERIAL INFECTION TODAY.  APPEARS TO BE VIRAL ILLNESS  Plan New Medications/Changes: BENZONATATE 200 MG CAPS (BENZONATATE) One by mouth hs as needed cough  #12 x 0, 04/11/2010, Donna Christen MD AMOXICILLIN 875 MG TABS (AMOXICILLIN) One by mouth two times a day (Rx void after 04/18/10)  #20 x 0, 04/11/2010, Donna Christen MD  New Orders: Est. Patient Level III [id number] Planning Comments:   Treat symptomatically for now:  expectorant, topical decongestant, cough suppressant at bedtime. If symptoms persist, or if fever develops, begin amoxicillin (given Rx to hold) Follow-up with PCP if not improving.   The patient and/or caregiver has been counseled thoroughly with regard to medications prescribed including dosage, schedule, interactions, rationale for use, and possible side effects and they verbalize understanding.  Diagnoses and expected course of recovery discussed and will return if not improved as expected or if the condition worsens. Patient and/or caregiver verbalized understanding.  Prescriptions: BENZONATATE 200 MG CAPS (BENZONATATE) One by mouth hs as needed cough  #12 x 0   Entered and Authorized by:   Donna Christen MD   Signed by:   Donna Christen MD on 04/11/2010   Method used:   Print then Give to Patient   RxID:   [id number] AMOXICILLIN 875 MG TABS (AMOXICILLIN) One by mouth two times a day (Rx void after 04/18/10)  #20 x 0   Entered and  Authorized by:   Donna Christen MD   Signed by:   Donna Christen MD on 04/11/2010   Method used:   Print then Give to Patient   RxID:   [id number]   Patient Instructions: 1)  May use Mucinex  (guaifenesin) twice daily for congestion. 2)  Increase fluid intake, rest. 3)  May use Afrin nasal spray (or generic oxymetazoline) twice daily for about 5 days.  Also recommend using saline nasal spray several times daily and/or saline nasal irrigation. 4)  Add amoxicillin if persistent fever develops, or if not improving 5 to 7 days. 5)  Followup with family doctor if not improving 7 to 10 days.  Orders Added: 1)  Est. Patient Level III [66063]

## 2010-08-31 NOTE — Letter (Signed)
Summary: Records from [facility] 2010 - 2011  Records from [facility] 2010 - 2011   Imported By: Maryln Gottron 07/07/2010 14:49:31  _____________________________________________________________________  External Attachment:    Type:   Image     Comment:   External Document

## 2010-08-31 NOTE — Progress Notes (Signed)
  Phone Note Outgoing Call Call back at [facility] Phone [phone]   Call placed by: Lajean Saver RN,  April 25, 2010 3:32 PM Call placed to: Patient  Follow-up for Phone Call        Callback: Patient is taking her antibiotic as prescribed and says her symptoms are improving, feel "much much" better. Given results of urine culture and to call if symptoms return shortly. Follow-up by: Lajean Saver RN,  April 25, 2010 3:33 PM

## 2010-08-31 NOTE — Miscellaneous (Signed)
Summary: PT Discharge/[facility]  PT Discharge/[facility]   Imported By: Lanelle Bal 08/23/2010 12:40:48  _____________________________________________________________________  External Attachment:    Type:   Image     Comment:   External Document

## 2010-08-31 NOTE — Letter (Signed)
[facility]   Imported By: Lanelle Bal 11/14/2009 11:56:08  _____________________________________________________________________  External Attachment:    Type:   Image     Comment:   External Document

## 2010-08-31 NOTE — Progress Notes (Signed)
  Phone Note Outgoing Call Call back at [facility] Phone [phone]   Call placed by: Lajean Saver RN,  April 13, 2010 5:22 PM Call placed to: Patient  Follow-up for Phone Call        Phone call completed Patient says she is feeling much better. Advised her to fill her Rx if she develops a fever or symptoms return. Follow-up by: Lajean Saver RN,  April 13, 2010 5:22 PM

## 2010-08-31 NOTE — Assessment & Plan Note (Signed)
Summary: NOV: Back Pain   Vital Signs:  Patient profile:   45 year old female Height:      67.5 inches Weight:      166 pounds Pulse rate:   59 / minute BP sitting:   110 / 65  (right arm) Cuff size:   regular  Vitals Entered By: Avon Gully CMA, Duncan Dull) (June 26, 2010 1:26 PM) CC: NP-est care,back pain   CC:  NP-est care and back pain.  History of Present Illness: Has had back pain for several years but feels it has been slowly getting worse.  Thought initially it was from Chemotherapy.  OK when first gets out of bed OK but worse after has been up getting ready or when sits for prolonged periods.  Low back pain.  Occ uses Ibuprogen.  Occ radiates into her right leg. Feels better when stretches out. No old injuries to her pain. Pain is evey day.  Today your pain is in the middle of hte low pain.   Pain is a 5/10.  Worried the Femara will make her pain worse. No xrays or imaging of that area.    Habits & Providers  Alcohol-Tobacco-Diet     Alcohol drinks/day: 0     Tobacco Status: quit  Exercise-Depression-Behavior     Does Patient Exercise: no     STD Risk: never     Drug Use: never     Seat Belt Use: always  Current Medications (verified): 1)  Daily Vitamins For Women  Tabs (Multiple Vitamins-Calcium) .Marland Kitchen.. 1 Tab By Mouth Once Daily 2)  Vitamin D 1000 Unit Tabs (Cholecalciferol) .Marland Kitchen.. 1 Tab By Mouth Once Daily 3)  Calcium Carbonate 1500 Mg Tabs (Calcium Carbonate) .... 2 Tabs By Mouth Once Daily 4)  Advil 200 Mg Tabs (Ibuprofen) .... As Needed  Allergies (verified): 1)  ! Morphine  Comments:  Nurse/Medical Assistant: The patient's medications and allergies were reviewed with the patient and were updated in the Medication and Allergy Lists. 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 Was on tamoxifen adn now on femara Oncologist is Dr. Donnie Coffin.  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  Smoking Status:  quit Does Patient Exercise:  no STD Risk:  never Drug Use:  never Seat Belt Use:  always  Review of Systems       No fever/sweats/weakness, unexplained weight loss/gain.  No vison changes.  No difficulty hearing/ringing in ears, hay fever/allergies.  No chest pain/discomfort, palpitations.  No Br lump/nipple discharge.  No cough/wheeze.  No blood in BM, nausea/vomiting/diarrhea.  No nighttime urination, leaking urine, unusual vaginal bleeding, discharge (penis or vagina).  No muscle/joint pain. No rash, change in mole.  No HA, memory loss.  No anxiety, sleep d/o, depression.  No easy bruising/bleeding, unexplained lump   Physical Exam  General:  Well-developed,well-nourished,in no acute distress; alert,appropriate and cooperative throughout examination Msk:  Normal flexion, extension, rotation right and left and side bending. Nontender lumbar spine and nontender SI joints. Neg staight leg bilat. Hip, knee and ankle strength bilat.   Neurologic:  Patellar tendons 1+ bilat in knees and ankles.    Impression & Recommendations:  Problem # 1:  BACK PAIN (ICD-724.5) Chronic at this point. I really think she would benefit from PT. Will also start with xrays since has had the pain for so long. Suspect the has DDD and possbily herniated disc since sometimes it does radiate into her right leg but not today. F/U in 6 weeks, if not improving consider MRI since has had the pain for over a year.  Her updated medication list for  this problem includes:    Advil 200 Mg Tabs (Ibuprofen) .Marland Kitchen... As needed  Orders: T-DG Lumbar Spine 2-3 Views (72100) Physical Therapy Referral (PT)  Complete Medication List: 1)  Daily Vitamins For Women Tabs (Multiple vitamins-calcium) .Marland Kitchen.. 1 tab by mouth once daily 2)  Vitamin D 1000 Unit Tabs (Cholecalciferol) .Marland Kitchen.. 1 tab by mouth once daily 3)  Calcium Carbonate 1500 Mg Tabs (Calcium carbonate) .... 2 tabs by mouth once daily 4)  Advil 200 Mg Tabs (Ibuprofen) .... As needed  Patient Instructions: 1)  Can use Ibuprofen 600mg  up to three times a day with food and water 2)  We will call you with the PT referral for your back.  3)  Follow up in 6 weeks.    Orders Added: 1)  T-DG Lumbar Spine 2-3 Views [72100] 2)  New Patient Level III [16109] 3)  Physical Therapy Referral [PT]

## 2010-10-16 LAB — CBC
HCT: 38.7 % (ref 36.0–46.0)
Hemoglobin: 10.7 g/dL — ABNORMAL LOW (ref 12.0–15.0)
MCHC: 34.9 g/dL (ref 30.0–36.0)
MCHC: 34.9 g/dL (ref 30.0–36.0)
MCV: 94.5 fL (ref 78.0–100.0)
Platelets: 186 10*3/uL (ref 150–400)
RDW: 12.4 % (ref 11.5–15.5)
RDW: 12.7 % (ref 11.5–15.5)

## 2010-10-17 LAB — HEPATIC FUNCTION PANEL
ALT: 35 U/L (ref 0–35)
Alkaline Phosphatase: 40 U/L (ref 39–117)
Bilirubin, Direct: 0.1 mg/dL (ref 0.0–0.3)
Indirect Bilirubin: 0.9 mg/dL (ref 0.3–0.9)

## 2010-10-17 LAB — CBC
HCT: 35 % — ABNORMAL LOW (ref 36.0–46.0)
HCT: 41 % (ref 36.0–46.0)
MCV: 94.1 fL (ref 78.0–100.0)
Platelets: 197 10*3/uL (ref 150–400)
RBC: 3.73 MIL/uL — ABNORMAL LOW (ref 3.87–5.11)
RDW: 12.5 % (ref 11.5–15.5)
WBC: 9.2 10*3/uL (ref 4.0–10.5)

## 2010-10-17 LAB — COMPREHENSIVE METABOLIC PANEL
Albumin: 4 g/dL (ref 3.5–5.2)
BUN: 14 mg/dL (ref 6–23)
Creatinine, Ser: 0.9 mg/dL (ref 0.4–1.2)
Potassium: 4.3 mEq/L (ref 3.5–5.1)
Total Protein: 7.1 g/dL (ref 6.0–8.3)

## 2010-10-17 LAB — URINALYSIS, ROUTINE W REFLEX MICROSCOPIC
Hgb urine dipstick: NEGATIVE
Protein, ur: NEGATIVE mg/dL
Urobilinogen, UA: 0.2 mg/dL (ref 0.0–1.0)

## 2010-10-17 LAB — DIFFERENTIAL
Lymphocytes Relative: 41 % (ref 12–46)
Lymphs Abs: 3.2 10*3/uL (ref 0.7–4.0)
Monocytes Absolute: 0.5 10*3/uL (ref 0.1–1.0)
Monocytes Relative: 7 % (ref 3–12)
Neutro Abs: 4 10*3/uL (ref 1.7–7.7)

## 2010-10-17 LAB — POCT PREGNANCY, URINE: Preg Test, Ur: NEGATIVE

## 2010-11-13 LAB — POCT URINALYSIS DIP (DEVICE)
Protein, ur: NEGATIVE mg/dL
Specific Gravity, Urine: <=1.005 (ref 1.005–1.030)
Urobilinogen, UA: 0.2 mg/dL (ref 0.0–1.0)
pH: 5.5 (ref 5.0–8.0)

## 2010-11-23 ENCOUNTER — Encounter: Payer: Self-pay | Admitting: Family Medicine

## 2010-11-24 ENCOUNTER — Ambulatory Visit (INDEPENDENT_AMBULATORY_CARE_PROVIDER_SITE_OTHER): Payer: BC Managed Care – PPO | Admitting: Family Medicine

## 2010-11-24 ENCOUNTER — Encounter: Payer: Self-pay | Admitting: Family Medicine

## 2010-11-24 VITALS — BP 113/70 | HR 47 | Ht 65.0 in | Wt 174.0 lb

## 2010-11-24 DIAGNOSIS — M549 Dorsalgia, unspecified: Secondary | ICD-10-CM

## 2010-11-24 DIAGNOSIS — N399 Disorder of urinary system, unspecified: Secondary | ICD-10-CM

## 2010-11-24 DIAGNOSIS — R3989 Other symptoms and signs involving the genitourinary system: Secondary | ICD-10-CM

## 2010-11-24 DIAGNOSIS — Z13220 Encounter for screening for lipoid disorders: Secondary | ICD-10-CM

## 2010-11-24 LAB — POCT URINALYSIS DIPSTICK
Glucose, UA: NEGATIVE
Leukocytes, UA: NEGATIVE
Spec Grav, UA: 1.01
Urobilinogen, UA: 0.2

## 2010-11-24 LAB — LIPID PANEL
Cholesterol: 238 mg/dL — ABNORMAL HIGH (ref 0–200)
VLDL: 23 mg/dL (ref 0–40)

## 2010-11-24 NOTE — Patient Instructions (Signed)
We will call you with the lab results. 

## 2010-11-24 NOTE — Progress Notes (Signed)
  Subjective:    Patient ID: Leah Ortiz, female    DOB: [birth year], 45 y.o.   MRN: [id number]  HPI  No urinary sxs. No pain or hemturia.  Noticed it is cloudy for a couple of weeks.  Her back pain is better after PT. When cold an rainy it hurts. Says when he does her exercises her back is better controlled.   Will check the cholesterol. She is fasting today.   Review of Systems     Objective:   Physical Exam  Constitutional: She appears well-developed and well-nourished.  Cardiovascular: Normal rate, regular rhythm and normal heart sounds.   Pulmonary/Chest: Effort normal and breath sounds normal.          Assessment & Plan:  Cloudy urine - Neg UA.  Call if becomes symptomatic  Back Pain - Rviewed her xray results weiht her.   Will check screening chol today.

## 2010-11-26 ENCOUNTER — Telehealth: Payer: Self-pay | Admitting: Family Medicine

## 2010-11-26 NOTE — Telephone Encounter (Signed)
Call pt: LDL cho is over 160. I rec chol med in addition to exercise. Low fat diet, and exercise. Let me know if [facility] for sending chol med.

## 2010-11-28 NOTE — Telephone Encounter (Signed)
Ok. Can do diet and exercise and recheck in 3 months.

## 2010-11-28 NOTE — Telephone Encounter (Signed)
Pt notified of results and does not want to start chol medication at this time and will try diet and exercise first.mailed labs to pt per request

## 2010-12-12 NOTE — Op Note (Signed)
Leah Ortiz, Leah Ortiz                 ACCOUNT NO.:  [id number]   MEDICAL RECORD NO.:  [id number]          PATIENT TYPE:  AMB   LOCATION:  DAY                          FACILITY:  [facility]   PHYSICIAN:  Anselm Pancoast. 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   OPERATIONS:  Fistulotomy, anal ultrasound, proctoscopy and excision of  hemorrhoidal tags x2, general anesthesia, lithotomy position.   HISTORY:  Leah Ortiz is a 45 year old female who I first saw her in the  office, actually I had seen her one time previously about a year ago  when she has a history as follows.  She has been seen in our office  several times from 2001 through 2005 which she was described as  perirectal abscess, pilonidal abscess and had several little drainage of  infection in the office.  She used to live in [place] and then recently  they moved to [place].  I saw her in 02/05 at which time I thought  that all of her it was in follow-up for my partners, thought that all of  her little incisions were closer to the anus and thought this was  probably a perirectal abscess that had been drained but she was having  no acute symptoms at that time and she was instructed if she is having  any further problems to return to see [place].  She did not until recently  when she started having swelling.  I saw her at Urgent Care in [place]  where she has recently moved and they lanced the little areas.  She said  she was very painful and she wanted to come back to see [place] and I saw her  in the office last week.  On examination at that time there was no  obvious perirectal abscess.  The little areas where I&D sites had been  done more posterior closer to the pilonidal area were not tender and on  a  little examination right at the anal area close to the sphincter just  to the right of posterior midline you could see a little external  opening.  The probe was placed into this and it goes up about 2 inches.  I could not find internal opening and it is slightly uncomfortable and I  recommended that we do examination under general anesthesia and used  anal ultrasound and she had done an internet search and of course picked  up information about high fistula internal openings and the problems of  incontinence afterwards.  I discussed with her that what I would  recommend is that we examined her under general anesthesia and see if we  could identify the internal fistula opening and if it was low, to go  into a sphincterotomy.  If it was high we would not do a complex  posterior sphincterotomy because of fear of not having anal control.  We  will also plan on doing a procto.  She has never had one and not had any  history that sounds like inflammatory bowel disease.  The patient is  here for the planned procedure.  She was given 3 grams of Unasyn  preoperatively and taken back to the operative suite.   She was first placed up in the yellow fin stirrups and on examination  you could see the little dimple posterior as described above and I put a  little probe back in it, this time it would go about an inch and a half.  With my finger in the anal canal I could not feel the probe coming out.  I then used an anal ultrasound and could see a little defect but this is  all really right at the sphincter area.  It was not up in the rectum  itself and then I put a little peroxide with a little probe in the  abscess cavity and then reultrasounded her and you could see the little  peroxide in the immediate adjacent area posterior to the anus and  sphincter mechanism.  I then removed the anal ultrasound, kind of  prepped the area with Betadine but did not really drape her out, but  used next a  proctoscope and as far as looking up into the lower rectum  and mid rectum there was certainly no evidence of inflammatory bowel  disease or other problems.  I used the peroxide in the little probe and  injected as we were removing the proctoscope and I could not see any  peroxide going into the rectum or the true sphincter area but it  certainly looked as if there was an area, nearly right at the actual  sphincter.  She had been described as having a fissure really about year  2000.  I then used the probe and bent it in a real fairly sharp bend and  could place it in the little area with my finger easily directed it  through a little opening where this little area of the internal opening  was really right at the most superficial to the little web of internal  sphincter posterior.  This was then divided with cautery and then using  a right-angle or the two little Sims, I also used a Kelly and opened on  the little area posterior where this little chronic cavity is so that it  would be good drainage.  I then reanoscoped her and could not  demonstrate any other areas.  The little sentinel pile that was  posteriorly I trimmed off with cautery and then used a couple of figure-  of-eight of 3-0 chromic sutures on both sides for hemostasis at the anal  mucosa edges.  There is also a hemorrhoidal tag inferior that is  moderate size that she has complained about and I went ahead and trimmed  it off but I did not do a formal hemorrhoidectomy anteriorly but just  kind of closed the mucosal edges with about three little figure-of-eight  sutures of 3-0 chromic for hemostasis.  I then used a corner of gauze  4x4 soaked in Betadine to place in the little abscess cavity and did not  inject any Xylocaine ointment or Xylocaine because of the little cavity  and infection possibility.  We will let her use Xylocaine 5% with her  first dressing change, soak in the bath tub two or three times a day and let me  see her in follow-up in approximately  two weeks.  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  I really wonder if the little fistula with the little areas that had  been drained in our office a little higher up communicated all along to  this area or not and I am not sure. I certainly find no evidence of any  inflammation or communication to this area now.  The little drain I&D  sites being about inch and half was so far up from the anus.           ______________________________  Anselm Pancoast. Zachery Dakins, M.D.     WJW/MEDQ  D:  01/01/2007  T:  01/01/2007  Job:  [id number]

## 2010-12-12 NOTE — Op Note (Signed)
Leah Ortiz, Leah Ortiz                 ACCOUNT NO.:  [id number]   MEDICAL RECORD NO.:  [id number]          PATIENT TYPE:  AMB   LOCATION:  [facility]                           FACILITY:  [facility]   PHYSICIAN:  Lenoard Aden, M.D.DATE OF BIRTH:  November 05, 1965   DATE OF PROCEDURE:  07/16/2007  DATE OF DISCHARGE:                               OPERATIVE REPORT   PREOPERATIVE DIAGNOSIS:  Pelvic pain.   POSTOPERATIVE DIAGNOSIS:  Extensive pelvic adhesions, bilateral  hydrosalpinges, probable endometriosis.   PROCEDURE:  Diagnostic hysteroscopy, lysis of adhesions, ablation  endometriosis, bilateral salpingectomy,possible left partial  oophorectomy.   SURGEON:  Lenoard Aden, M.D.   ASSISTANT:  Lendon Colonel, MD   ANESTHESIA:  General.   ESTIMATED BLOOD LOSS:  50 mL.   COMPLICATIONS:  None.   COUNTS:  Correct.   Patient to recovery in good condition.   SPECIMEN:  Bilateral hydrosalpinges, partial left ovarian remnant.   DESCRIPTION OF PROCEDURE:  Being apprised of risks of anesthesia,  infection, bleeding, injury to abdominal organs, need for repair,  delayed versus immediate complications to include bowel and bladder  injury, possible need for reoperation, possible inability to cure pelvic  pain, patient brought to operating.  She was administered general  anesthetic without complication, prepped, draped in usual sterile  fashion.  Feet are placed in yellow fin stirrups.  Rubens cannula placed  per vagina.  Infraumbilical incision made with scalpel.  Fascia  identified and opened sharply, pursestring suture 0 Vicryl placed.  Hassan trocar placed atraumatically.  Pictures taken.  Visualization  revealed extensive omental adhesions to the anterior abdominal wall.  Two 5 mm __________ made in the bilateral left and right lower quadrants  under direct visualization and these adhesions were adhered from the  anterior abdominal wall by using Endo shears with good hemostasis noted.  There  are extensive adhesions of the body and fundus of the uterus and  bladder flap to the anterior abdominal wall which are very thick and  questionable involving an extensive blood supply.  Therefore decision  was made to not take down the adhesions due to the possible need for  hysterectomy during this process.  Right ovary is identified as being  normal.  The right tube was traced out to an otherwise divided  fimbriated end with hydrosalpinx noted.  This is ligated along the  mesosalpingeal portion down to the level of the cornua and excised and  removed.  Along the right tube, the right tube is extensively adhered  into the cul-de-sac along the left pelvic sidewall.  These adhesions are  lysed revealing a questionably adhered left ovary and to the left  ovarian fossa and a hydrosalpinges/ hematosalpinges that extends down  into the cul-de-sac.  This area is then elevated after extensive lysis  of adhesions and ablation of endometriosis along the posterior uterine  wall cul-de-sac and this segment is removed in two pieces using the  Gyrus extending it to the uterine cornua good hemostasis is noted.  There is what appears to be segment of left ovary still present which is  adherent  to the left pelvic sidewall, the right ovary appears normal.  The appendix is not visualized, gallbladder is not visualized.  Otherwise the omentum appears to have no further bleeding.  Irrigation  was accomplished, CO2 was released and visualization reveals no active  bleeding.  Specimens were then placed an EndoCatch and removed through  the 10-mm port.  Revisualization reveals no evidence of active bleeding  along the area of adhesiolysis or surgical site removal.  At this time  all instruments were removed under visualization.  CO2 is reinsufflated  again.  Incisions were closed.  Pursestrings tied 0 Vicryl, 4-0 Vicryl  for umbilical incision port.  Dermabond placed on all three ports.  Instruments removed  from the vagina.  The patient tolerated procedure  well, transferred to recovery in good condition,      Lenoard Aden, M.D.  Electronically Signed     RJT/MEDQ  D:  07/16/2007  T:  07/17/2007  Job:  [id number]

## 2010-12-12 NOTE — Op Note (Signed)
Leah Ortiz, Leah Ortiz                 ACCOUNT NO.:  [id number]   MEDICAL RECORD NO.:  [id number]          PATIENT TYPE:  AMB   LOCATION:  [facility]                          FACILITY:  [facility]   PHYSICIAN:  Anselm Pancoast. Weatherly, M.D.DATE OF BIRTH:  September 14, 1965   DATE OF PROCEDURE:  10/14/2007  DATE OF DISCHARGE:                               OPERATIVE REPORT   PREOPERATIVE DIAGNOSIS:  Carcinoma of the right breast.   OPERATION:  Placement of Port-A-Cath, left subclavian area.   ANESTHESIA:  Heavy sedation with local anesthesia.   SURGEON:  Anselm Pancoast. Zachery Dakins, M.D.   INDICATIONS FOR PROCEDURE:  Ms. Leah Ortiz is a 45 year old female whom  I saw approximately two weeks ago with a recently diagnosed right breast  cancer.  She had a mass that was approximately in the 9 o'clock position  in her right breast.  There was bruising where she had the biopsy.  She  had an MRI since then, that I do not think shows any other areas of  question.   Her past history is significant in that she has had no definite  immediate family history of breast cancer, but she had mammograms and  also an MRI approximately two years earlier that had not shown any areas  of question of cancer.  When I saw her in the office, she wanted to  proceed with immediate surgery.  She did not want any chemotherapy.  Then she saw Dr. Pierce Crane and after he talked with her, he has  recommended that she do preoperative chemotherapy for four months and  then have a lumpectomy.  Today she is bringing up that she may have  possibly elected to proceed with bilateral mastectomies and  reconstruction, because of the failure to diagnose this cancer earlier.  All of that is a decision that she ultimately will make of what she  desires.  I agree with the preoperative chemotherapy.  The mass is not  large enough that it is trying to reduce it, but I think it is  reasonable to proceed in this manner.  She understands the planned  procedure.   She has a right breast cancer and we will put it in the  left.  She was given 1 gram of Ancef preoperatively.   DESCRIPTION OF PROCEDURE:  The patient was taken back to the operative  suite.  She was positioned on the operating room table.  LOA sedation  was administered by the IV anesthetist.  Then the left breast  significant follicular area, etc was all prepped with Betadine solution  and she was draped in a sterile manner.  I placed a hemostat where I  think the atrial superior vena cava junction should be and then brought  the C-arm in and it appears that we picked pretty close to the proper  position.  I moved the hemostasis about 1 cm.  I then had anesthetized  the subclavian area with about 5 or 6 mL of 1% plain Xylocaine and then  on the first pass was able to enter the vein and then slip the guide  wire in place.  She was in the Trendelenburg position with this, and  then I placed her back up into a flat position.  Next, I anesthetized  the little area where the reservoir will be, and we used the Bard port  implanted port, detached, and made a little subcutaneous pocket above  the true breast tissue, down on the pectoral fascia and then where the  previously-heparinized fill reservoir two sutures of #2-0 Prolene were  placed, but these were not tied.  I then placed her back into kind of a  mild Trendelenburg position and then slipped the introducer over the  guide wire.  It dilated easily and then withdrew the dilator and slipped  the Silastic catheter within the sheath.  I then removed the sheath and  it appears that about 25 cm from the actual Port-A-Cath hub is where the  area should be.  I checked the position with the C-arm with her flat,  and then ultimately shortened the catheter just about 2 cm, so that  where I actually attached it to the reservoir was about 23 cm.  It  appears to be in good position.  It should be pretty close to the  superior vena cava/atrial  junction and I then slipped a little locking  placing device.  I then tied the two previously-placed Prolene sutures  and placed another one superiorly, kind of pushing the reservoir down  slightly and then checked the catheter tip position and also where the  catheter was going into the Port-A-Cath there was no kink or problems.  The area is lying flat.  Then I used a few #3-0 chromic sutures to kind  of close the skin or subcutaneous tissue around the reservoir and then  #5-0 Dexon subcuticular sutures interrupted for both the skin and also  the catheter insertion site.  Benzoin and Steri-Strips were placed on  the skin.  I had filled the reservoir first with dilute saline heparin  and then the 100 units per mL and injected these, with good blood  return.   I will get a postoperative chest x-ray in the recovery room.   I think she is going to start her chemotherapy next week under Dr.  Renelda Loma management.           ______________________________  Anselm Pancoast. Zachery Dakins, M.D.     WJW/MEDQ  D:  10/14/2007  T:  10/14/2007  Job:  [id number]

## 2010-12-14 ENCOUNTER — Encounter (HOSPITAL_BASED_OUTPATIENT_CLINIC_OR_DEPARTMENT_OTHER): Payer: BC Managed Care – PPO | Admitting: Oncology

## 2010-12-14 ENCOUNTER — Other Ambulatory Visit: Payer: Self-pay | Admitting: Oncology

## 2010-12-14 DIAGNOSIS — C50419 Malignant neoplasm of upper-outer quadrant of unspecified female breast: Secondary | ICD-10-CM

## 2010-12-14 DIAGNOSIS — Z170 Estrogen receptor positive status [ER+]: Secondary | ICD-10-CM

## 2010-12-14 DIAGNOSIS — C50919 Malignant neoplasm of unspecified site of unspecified female breast: Secondary | ICD-10-CM

## 2010-12-14 LAB — CBC WITH DIFFERENTIAL/PLATELET
BASO%: 0.7 % (ref 0.0–2.0)
Eosinophils Absolute: 0.1 10*3/uL (ref 0.0–0.5)
MCHC: 34.5 g/dL (ref 31.5–36.0)
MONO#: 0.3 10*3/uL (ref 0.1–0.9)
NEUT#: 1.5 10*3/uL (ref 1.5–6.5)
Platelets: 220 10*3/uL (ref 145–400)
RBC: 4.96 10*6/uL (ref 3.70–5.45)
RDW: 13.1 % (ref 11.2–14.5)
WBC: 4 10*3/uL (ref 3.9–10.3)
lymph#: 2.1 10*3/uL (ref 0.9–3.3)

## 2010-12-15 LAB — COMPREHENSIVE METABOLIC PANEL
Albumin: 4.9 g/dL (ref 3.5–5.2)
Alkaline Phosphatase: 74 U/L (ref 39–117)
CO2: 21 mEq/L (ref 19–32)
Calcium: 10.4 mg/dL (ref 8.4–10.5)
Chloride: 103 mEq/L (ref 96–112)
Glucose, Bld: 90 mg/dL (ref 70–99)
Potassium: 4.1 mEq/L (ref 3.5–5.3)
Sodium: 142 mEq/L (ref 135–145)
Total Protein: 7.3 g/dL (ref 6.0–8.3)

## 2010-12-15 LAB — VITAMIN D 25 HYDROXY (VIT D DEFICIENCY, FRACTURES): Vit D, 25-Hydroxy: 54 ng/mL (ref 30–89)

## 2010-12-15 NOTE — H&P (Signed)
Leah Ortiz, Leah Ortiz                         ACCOUNT NO.:  [id number]   MEDICAL RECORD NO.:  [id number]                   PATIENT TYPE:  AMB   LOCATION:  [facility]                                  FACILITY:  [facility]   PHYSICIAN:  Lenoard Aden, M.D.             DATE OF BIRTH:  1966-06-04   DATE OF ADMISSION:  DATE OF DISCHARGE:                                HISTORY & PHYSICAL   DATE OF ADMISSION:  Dec 23, 2003   CHIEF COMPLAINT:  Menometrorrhagia, for definitive therapy.   HISTORY OF PRESENT ILLNESS:  The patient is a 45 year old white female G2 P2  status post C-section x2 with tubal ligation who presents with worsening  menorrhagia.  She has had a normal laboratory workup.  She has had a normal  saline sonohysterography.  She wishes to pursue more definitive therapy.   PAST MEDICAL HISTORY:  Remarkable for superficial varicosities, occasional  cystitis.   FAMILY HISTORY:  Coronary vascular disease, superficial thrombophlebitis,  diet-controlled diabetes, cardiovascular disease, and drug abuse.   PAST OBSTETRICAL HISTORY:  Remarkable for a C-section in 1998, C-section in  2002, D&E in 1987, tubal ligation with C-section in 2002.   GYNECOLOGICAL HISTORY:  Otherwise unremarkable.   MEDICATIONS:  None.   ALLERGIES:  None.   PHYSICAL EXAMINATION:  GENERAL:  She is a well-developed, well-nourished  white female in no acute distress.  HEENT:  Normal.  LUNGS:  Clear.  HEART:  Regular rhythm.  ABDOMEN:  Soft, nontender.  PELVIC:  Reveals an anteflexed uterus, slightly bulky, with known intramural  fibroids noted.  No adnexal masses.   IMPRESSION:  Menometrorrhagia with normal saline sonohysterography.   PLAN:  Proceed with endometrial ablation.  The risks and benefits discussed.  Risks of anesthesia, infection, bleeding, and uterine perforation with  injury to abdominal organs and need for repair is discussed.  Inability to  cure  heavy bleeding with possible need for  hysterectomy previously discussed.  Probable improvement rate in bleeding patterns to approach 80% with a 40%  amenorrhea rate after 1 year noted.  The patient acknowledges and wishes to  proceed.                                               Lenoard Aden, M.D.    RJT/MEDQ  D:  12/22/2003  T:  12/22/2003  Job:  [id number]

## 2010-12-15 NOTE — Op Note (Signed)
[facility]  Patient:    Leah Ortiz, Leah Ortiz Visit Number: [id number] MRN: [id number]          Service Type: OBS Location: 910A 9148 01 Attending Physician:  Lenoard Aden Dictated by:   Lenoard Aden, M.D. Proc. Date: 05/15/01 Admit Date:  05/15/2001                             Operative Report  PREOPERATIVE DIAGNOSES:       A 39 week intrauterine pregnancy, previous cesarean section for elective repeat and tubal sterilization.  POSTOPERATIVE DIAGNOSES:      A 39 week intrauterine pregnancy, previous cesarean section for elective repeat and tubal sterilization, right lateral uterine adhesions.  PROCEDURE:                    Repeat low transverse cesarean section, bilateral partial salpingectomy, lysis of adhesions.  SURGEON:                      Lenoard Aden, M.D.  ASSISTANT:                    Marina Gravel, M.D.  ANESTHESIA:                   Spinal by Hatchett.  ESTIMATED BLOOD LOSS:         1000 cc.  COMPLICATIONS:                None.  DISPOSITION:                  Patient in recovery in good condition.  DRAINS:                       Foley.  FINDINGS:                     Full-term living female.  Apgars 8 and 9. Bilateral tubal segments excised.  Normal ovaries noted.  Adhesions to the right round ligament and lower uterine segment dissected sharply.  OPERATIVE NOTE:               After being apprised of the risks of anesthesia, infection, bleeding, injury to abdominal organs, need for repair, failure rate of tubal ligation 5-10 per 1000, patient acknowledges and desires to proceed. After achieving adequate spinal anesthesia she is prepped and draped in the usual sterile fashion.  Foley catheter placed.  After achieving adequate anesthesia dilute Marcaine solution placed in the area of previous Pfannenstiel incision.  Scalpel used to carry down to the incision to the fascia which was nicked in the midline and opened  transversely using Mayo scissors.  Rectus muscles dissected sharply in the midline.  Peritoneum entered sharply and cephalad to the bladder.  At this time bladder blade placed.  Visualization reveals adhesions of the anterior abdominal wall to the round ligament area which are dissected sharply without difficulty.  Bladder flap is then developed sharply using Metzenbaum scissors.  Uterus is scored in a smiley fashion.  Amniotomy for clear fluid.  Delivery using vacuum assistance of a full-term living female 8 pounds 14 ounces.  Handed to pediatricians in attendance, receiving Apgars 8 and 9.  Placenta delivered manually, intact.  Uterus is exteriorized.  Cervix is dilated using a sponge forceps.  Incision closed using 0 Monocryl in a continuous running fashion.  Good hemostasis is noted.  Second interrupted suture is placed at the left lateral margin to achieve hemostasis.  At this time right tube is traced out to the fimbriated end and ______ of the tube is grasped using the Babcock clamp and avascular portion of the mesosalpinx was cauterized.  0 ties are placed proximally and distally and tubal segment is excised.  Both tubal lumens are cauterized and the same procedure is done on the left tube.  Good hemostasis noted.  Bladder flap inspected.  Ovaries inspected.  Uterus replaced in the abdominal cavity.  Both tubal areas appear to good hemostasis. Pericolic gutters irrigated.  All blood clots subsequently removed.  At this time after achieving good hemostasis fascia closed using 0 Vicryl in a continuous running fashion.  Skin closed using staples.  Patient tolerates procedure well.  Transferred to recovery in good condition. Dictated by:   Lenoard Aden, M.D. Attending Physician:  Lenoard Aden DD:  05/15/01 TD:  05/16/01 Job: [id number]

## 2010-12-15 NOTE — H&P (Signed)
[facility]  Patient:    Leah Ortiz, Leah Ortiz Visit Number: [id number] MRN: [id number]          Service Type: OBS Location: [facility] Attending Physician:  Lenoard Aden Dictated by:   Lenoard Aden, M.D. Adm. Date:  05/15/01                           History and Physical  CHIEF COMPLAINT:              Repeat C-section.  HISTORY OF PRESENT ILLNESS:   Patient is a 44 year old white female, G3, P1-0-1-1, EDD of May 23, 2001, at 39 weeks, who presents for elective repeat C-section and tubal sterilization.  PAST MEDICAL HISTORY:         Past medical history remarkable for superficial varicosities.  No other medical or surgical hospitalizations.  Occasional history of cystitis.  FAMILY HISTORY:               Family history of cardiovascular disease, superficial thrombophlebitis, diet-controlled diabetes, cerebrovascular disease and drug abuse.  PAST OBSTETRICAL HISTORY:     Primary C-section in 1998 for breech, D&E in 1987.  GYNECOLOGICAL HISTORY:        Unremarkable.  MEDICATIONS:                  Prenatal vitamins and iron.  ALLERGIES:                    None.  OBSTETRICAL HISTORY:          Blood type A-negative, Rh-antibody negative, toxoplasmosis titers negative, VDRL nonreactive, rubella immune, hepatitis B surface antigen negative, HIV nonreactive.  PHYSICAL EXAMINATION:  GENERAL:                      She is a well-developed, well-nourished white female in no apparent distress.  HEENT:                        Normal.  LUNGS:                        Clear.  HEART:                        Regular rate and rhythm.  ABDOMEN:                      Soft, gravid, nontender.  PELVIC:                       Cervix is closed, 2-cm long, firm.  Vertex -3.  EXTREMITIES:                  No cords.  NEUROLOGIC:                   Exam is nonfocal.  IMPRESSION:                   1. Thirty-nine-week intrauterine pregnancy.             2. Previous cesarean section, desire for                                  elective repeat.  3. Desire for elective sterilization.  PLAN:                         Proceed with a repeat C-section and tubal sterilization.  Risks of anesthesia, infection, bleeding, injury to abdominal organs with need for repair are discussed, delayed versus immediate complications to include bowel or bladder injury are noted.  Patient acknowledges and desires to proceed. Dictated by:   Lenoard Aden, M.D. Attending Physician:  Lenoard Aden DD:  05/15/01 TD:  05/15/01 Job: [id number]

## 2010-12-15 NOTE — Op Note (Signed)
Leah Ortiz, Leah Ortiz                         ACCOUNT NO.:  [id number]   MEDICAL RECORD NO.:  [id number]                   PATIENT TYPE:  AMB   LOCATION:  [facility]C                                  FACILITY:  [facility]   PHYSICIAN:  Lenoard Aden, M.D.             DATE OF BIRTH:  March 07, 1966   DATE OF PROCEDURE:  12/23/2003  DATE OF DISCHARGE:                                 OPERATIVE REPORT   PREOPERATIVE DIAGNOSES:  1. Menometrorrhagia.  2. Uterine fibroids.   POSTOPERATIVE DIAGNOSES:  1. Menometrorrhagia.  2. Uterine fibroids.   PROCEDURE:  Diagnostic hysteroscopy, dilatation and curettage, and Novasure  endometrial ablation.   SURGEON:  Lenoard Aden, M.D.   ANESTHESIA:  General.   ESTIMATED BLOOD LOSS:  Less than 50 mL.   COMPLICATIONS:  None.   FLUID DEFICIT:  80 mL.   Patient to recovery in good condition.   SPECIMENS:  Endometrial curettings to pathology.   BRIEF OPERATIVE NOTE:  After being apprised of the risks of anesthesia,  infection, bleeding, injury to abdominal organs and need for repair,  inability to cure bleeding patterns, the patient was brought to the  operating room, where she was administered a general anesthetic without  complications, prepped and draped in the usual sterile fashion, catheterized  until the bladder was empty.  After achieving adequate anesthesia, dilute  Nesacaine placed in a standard paracervical fashion.  The uterine cavity is  measured at 12 cm, cervical length at 5 cm, for a total cavity length of 7  cm.  At this time the diagnostic hysteroscope is placed.  Visualization  reveals bilateral normal tubal ostia and no evidence of submucous fibroids.  D&C performed, adequate tissue is collected with the sharp serrated curette.  Re-hysteroscopy reveals no evidence of submucous fibroids.  The endometrial  ablation device is placed, the device is seated in a standard fashion.  The  array is opened and locked after seating it.  The  CO2 check is done and is completed.  Endometrial ablation is completed  without difficulty.  Repeat hysteroscopy reveals the cavity to be well  ablated, no evidence of focal lesions.  At this time all instruments are  removed.  The patient tolerates the procedure well.  The patient is awakened  and transferred to recovery in good condition.                                               Lenoard Aden, M.D.    RJT/MEDQ  D:  12/23/2003  T:  12/23/2003  Job:  [id number]

## 2010-12-15 NOTE — Discharge Summary (Signed)
[facility]  Patient:    Leah Ortiz, Leah Ortiz Visit Number: [id number] MRN: [id number]          Service Type: OBS Location: [facility] Attending Physician:  Lenoard Aden Dictated by:   Lenoard Aden, M.D. Adm. Date:  05/15/01 Disc. Date: 05/27/01                             Discharge Summary  The patient underwent an uncomplicated repeat low segment transverse cesarean section and bilateral partial salpingectomy on May 15, 2001. Postoperative course uncomplicated.  Tolerated a regular diet well. Discharged to home on day #2.  Prenatal vitamins, iron, and Percocet given. Follow up in the office in four to six weeks. Dictated by:   Lenoard Aden, M.D. Attending Physician:  Lenoard Aden DD:  06/03/01 TD:  06/05/01 Job: [id number]

## 2010-12-22 ENCOUNTER — Ambulatory Visit (HOSPITAL_COMMUNITY)
Admission: RE | Admit: 2010-12-22 | Discharge: 2010-12-22 | Disposition: A | Discharge status: Home / Self Care | Payer: BC Managed Care – PPO | Source: Ambulatory Visit | Attending: Oncology | Admitting: Oncology

## 2010-12-22 DIAGNOSIS — C50919 Malignant neoplasm of unspecified site of unspecified female breast: Secondary | ICD-10-CM | POA: Insufficient documentation

## 2010-12-22 MED ORDER — TECHNETIUM TC 99M MEDRONATE IV KIT
25.0000 | PACK | Freq: Once | INTRAVENOUS | Status: AC | PRN
  Administered 2010-12-22: 25 via INTRAVENOUS

## 2011-02-06 ENCOUNTER — Other Ambulatory Visit: Payer: Self-pay | Admitting: Obstetrics and Gynecology

## 2011-04-23 LAB — POCT HEMOGLOBIN-HEMACUE: Hemoglobin: 14.1

## 2011-05-04 LAB — CBC
Platelets: 249
RDW: 12.8
WBC: 7.8

## 2011-05-07 LAB — POCT PREGNANCY, URINE
Operator id: 117411
Preg Test, Ur: NEGATIVE

## 2011-05-07 LAB — URINALYSIS, ROUTINE W REFLEX MICROSCOPIC
Ketones, ur: NEGATIVE
Leukocytes, UA: NEGATIVE
Nitrite: NEGATIVE
Specific Gravity, Urine: >1.03 — ABNORMAL HIGH
pH: 5.5

## 2011-05-07 LAB — DIFFERENTIAL
Basophils Absolute: 0
Lymphocytes Relative: 25
Lymphs Abs: 1.6
Neutro Abs: 4.5
Neutrophils Relative %: 68

## 2011-05-07 LAB — URINE MICROSCOPIC-ADD ON

## 2011-05-07 LAB — CBC
Platelets: 229
RDW: 12.6
WBC: 6.6

## 2011-05-17 LAB — COMPREHENSIVE METABOLIC PANEL
ALT: 39 — ABNORMAL HIGH
Calcium: 8.9
Glucose, Bld: 85
Sodium: 138
Total Protein: 6.7

## 2011-05-17 LAB — DIFFERENTIAL
Eosinophils Absolute: 0.1
Lymphs Abs: 2.2
Monocytes Relative: 11
Neutro Abs: 2.2
Neutrophils Relative %: 42 — ABNORMAL LOW

## 2011-05-17 LAB — CBC
Hemoglobin: 14.1
MCHC: 34.4
Platelets: 227
RDW: 13.6

## 2011-06-19 ENCOUNTER — Telehealth: Payer: Self-pay | Admitting: Oncology

## 2011-06-19 ENCOUNTER — Other Ambulatory Visit: Payer: BC Managed Care – PPO | Admitting: Lab

## 2011-06-19 ENCOUNTER — Ambulatory Visit (HOSPITAL_BASED_OUTPATIENT_CLINIC_OR_DEPARTMENT_OTHER): Payer: BC Managed Care – PPO

## 2011-06-19 ENCOUNTER — Ambulatory Visit (HOSPITAL_BASED_OUTPATIENT_CLINIC_OR_DEPARTMENT_OTHER): Payer: BC Managed Care – PPO | Admitting: Oncology

## 2011-06-19 DIAGNOSIS — C50919 Malignant neoplasm of unspecified site of unspecified female breast: Secondary | ICD-10-CM

## 2011-06-19 DIAGNOSIS — E559 Vitamin D deficiency, unspecified: Secondary | ICD-10-CM

## 2011-06-19 LAB — CBC WITH DIFFERENTIAL/PLATELET
Basophils Absolute: 0 10*3/uL (ref 0.0–0.1)
Eosinophils Absolute: 0.1 10*3/uL (ref 0.0–0.5)
HGB: 14 g/dL (ref 11.6–15.9)
LYMPH%: 50.6 % — ABNORMAL HIGH (ref 14.0–49.7)
MCV: 91 fL (ref 79.5–101.0)
MONO%: 7.7 % (ref 0.0–14.0)
NEUT#: 2.1 10*3/uL (ref 1.5–6.5)
Platelets: 248 10*3/uL (ref 145–400)

## 2011-06-19 NOTE — Progress Notes (Signed)
Hematology and Oncology Follow Up Visit  Leah Ortiz [id number] 1965/11/18 45 y.o. 06/19/2011 2:25 PM   Principle Diagnosis: Cancer, status post neoadjuvant chemotherapy followed by bilateral mastectomies with reconstruction, some until September 2009 with Herceptin completed March 2010 S/p BSO 01/03/2010 letrozole.  Interim History:  Returns doing well. He had an MRI at [facility] which was negative. She feels well without specific complaints. Medications: I have reviewed the patient's current medications.  Allergies:  Allergies  Allergen Reactions  . Morphine     REACTION: GI Upset    Past Medical History, Surgical history, Social history, and Family History were reviewed and updated.  Review of Systems: Constitutional:  Negative for fever, chills, night sweats, anorexia, weight loss, pain. Cardiovascular: no chest pain or dyspnea on exertion Respiratory: no cough, shortness of breath, or wheezing Neurological: no TIA or stroke symptoms Dermatological: negative ENT: negative Skin Gastrointestinal: no abdominal pain, change in bowel habits, or black or bloody stools Genito-Urinary: no dysuria, trouble voiding, or hematuria Hematological and Lymphatic: negative Breast: negative for breast lumps Musculoskeletal: negative Remaining ROS negative.  Physical Exam: Blood pressure 113/75, pulse 76, temperature 98.5 F (36.9 C), height 5\' 7"  (1.702 m), weight 181 lb 3.2 oz (82.192 kg). ECOG: 0 HEENT:  Sclerae anicteric, conjunctivae pink.  Oropharynx clear.  No mucositis or candidiasis.  Nodes:  No cervical, supraclavicular, or axillary lymphadenopathy palpated.  Breast Exam: both s/p reconstruction,  Right breast is benign.  No masses, discharge, skin change, or nipple inversion.  Left breast is benign.  No masses, discharge, skin change, or nipple inversion..  Lungs:  Clear to auscultation bilaterally.  No crackles, rhonchi, or wheezes.  Heart:  Regular rate and rhythm.  Abdomen:  Soft,  nontender.  Positive bowel sounds.  No organomegaly or masses palpated.  Musculoskeletal:  No focal spinal tenderness to palpation.  Extremities:  Benign.  No peripheral edema or cyanosis.  Skin:  Benign.  Neuro:  Nonfocal.    Lab Results: Lab Results  Component Value Date   WBC 4.0 12/14/2010   HGB 15.4 12/14/2010   HCT 44.8 12/14/2010   MCV 90.4 12/14/2010   PLT 220 12/14/2010     Chemistry      Component Value Date/Time   NA 142 12/14/2010 0900   NA 142 12/14/2010 0900   K 4.1 12/14/2010 0900   K 4.1 12/14/2010 0900   CL 103 12/14/2010 0900   CL 103 12/14/2010 0900   CO2 21 12/14/2010 0900   CO2 21 12/14/2010 0900   BUN 23 12/14/2010 0900   BUN 23 12/14/2010 0900   CREATININE 0.81 12/14/2010 0900   CREATININE 0.81 12/14/2010 0900      Component Value Date/Time   CALCIUM 10.4 12/14/2010 0900   CALCIUM 10.4 12/14/2010 0900   ALKPHOS 74 12/14/2010 0900   ALKPHOS 74 12/14/2010 0900   AST 18 12/14/2010 0900   AST 18 12/14/2010 0900   ALT 16 12/14/2010 0900   ALT 16 12/14/2010 0900   BILITOT 0.8 12/14/2010 0900   BILITOT 0.8 12/14/2010 0900       Radiological Studies: chest X-ray n/a Mammogram n/a Bone density n/a  Impression and Plan: And is doing well. Continue Metrazol. She is minimum and fatigue and joint pains.She is working   full time as well as take a business course on line. We'll see her in 6 months time. Blood work will be drawn after her visit and in 6 months time as well.   More than 50% of  the visit was spent in patient-related counselling   Pierce Crane, MD 11/20/20122:25 PM

## 2011-06-19 NOTE — Telephone Encounter (Signed)
Gv pt appt for may2013 

## 2011-06-20 ENCOUNTER — Telehealth: Payer: Self-pay | Admitting: *Deleted

## 2011-06-20 LAB — COMPREHENSIVE METABOLIC PANEL
CO2: 28 mEq/L (ref 19–32)
Creatinine, Ser: 0.75 mg/dL (ref 0.50–1.10)
Glucose, Bld: 89 mg/dL (ref 70–99)
Total Bilirubin: 0.6 mg/dL (ref 0.3–1.2)
Total Protein: 7.3 g/dL (ref 6.0–8.3)

## 2011-06-20 LAB — CANCER ANTIGEN 27.29: CA 27.29: 29 U/mL (ref 0–39)

## 2011-06-20 LAB — VITAMIN D 25 HYDROXY (VIT D DEFICIENCY, FRACTURES): Vit D, 25-Hydroxy: 61 ng/mL (ref 30–89)

## 2011-06-20 NOTE — Telephone Encounter (Signed)
Returned patient's call and left msg to call back for detailed lab work results

## 2011-06-20 NOTE — Telephone Encounter (Signed)
Pt called and given lab results over the phone.

## 2011-10-01 ENCOUNTER — Telehealth: Payer: Self-pay

## 2011-10-01 NOTE — Telephone Encounter (Signed)
MS. PRIDDY IS CALLING CC OF EMOTIONAL LABILITY AND CRACKING/ POPPING LEG JOINTS BEGINNING A COUPLE A WEEKS AGO. SHE HAS BEEN ON THE FEMARA ~ 8 MONTHS. TOLD HER THAT CHRIS SCHERER PA-C SAID THAT THESE SYMPTOMS ARE VERY COMMON WITH THE FEMARA EVEN BING ON THE MEDICATION FOR A WHILE. CHRIS SAID FOR HER TO STOP THE MEDICATION AND CALL BACK IN 2-3 WEEKS TO TO DR. RUBIN'S NURSE TO LET HER KNOW HOW YOU AR FEELING. PENDING ON HOW SHE IS FEELING SHE MAY BE ABLE TO TRY ANOTHER AI AND SEE HOW SHE DOES ON IT AND F/U IN MAY WITH DR. Donnie Coffin.  PT. VERBALIZED UNDERSTANDING.

## 2011-11-06 ENCOUNTER — Encounter: Payer: Self-pay | Admitting: *Deleted

## 2011-11-06 ENCOUNTER — Emergency Department (INDEPENDENT_AMBULATORY_CARE_PROVIDER_SITE_OTHER)
Admission: EM | Admit: 2011-11-06 | Discharge: 2011-11-06 | Disposition: A | Discharge status: Home / Self Care | Payer: BC Managed Care – PPO | Source: Home / Self Care

## 2011-11-06 DIAGNOSIS — H6691 Otitis media, unspecified, right ear: Secondary | ICD-10-CM

## 2011-11-06 DIAGNOSIS — H6690 Otitis media, unspecified, unspecified ear: Secondary | ICD-10-CM

## 2011-11-06 DIAGNOSIS — J069 Acute upper respiratory infection, unspecified: Secondary | ICD-10-CM

## 2011-11-06 MED ORDER — PREDNISONE 10 MG PO TABS: ORAL_TABLET | ORAL | Status: DC

## 2011-11-06 MED ORDER — BENZONATATE 200 MG PO CAPS: 200.0000 mg | ORAL_CAPSULE | Freq: Every day | ORAL | Status: AC

## 2011-11-06 NOTE — ED Provider Notes (Signed)
History     CSN: [id number]  Arrival date & time 11/06/11  0831   None     Chief Complaint  Patient presents with  . Sore Throat  . Back Pain    lower      HPI Comments: Patient complains of 2 day history of gradually progressive URI symptoms beginning with a mild sore throat (now improved), followed by progressive nasal congestion.  A cough started next.  Complains of fatigue and initial myalgias especially in lower back.  No GU symptoms.  Cough is now worse at night and generally non-productive during the day.  There has been no pleuritic pain, shortness of breath, or wheezes.  She developed a right earache today.  She had started on a Z-pack at onset of symptoms.  The history is provided by the patient.    Past Medical History  Diagnosis Date  . 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 Was on Tamoxifen and now on Femara. Oncologist is Dr Donnie Coffin    Past Surgical History  Procedure Date  . Cesarean section 1998, 2002  . Mastectomy 8-09  . Abdominal hysterectomy   . Breast surgery     implants  . Cholecystectomy     History reviewed. No pertinent family history.  History  Substance Use Topics  . Smoking status: Former Smoker    Quit date: 08/31/2007  . Smokeless tobacco: Not on file  . Alcohol Use: No    OB History    Grav Para Term Preterm Abortions TAB SAB Ect Mult Living                  Review of Systems + sore throat + cough No pleuritic pain No wheezing + nasal congestion + post-nasal drainage No sinus pain/pressure No itchy/red eyes + right earache No hemoptysis No SOB No fever, + chills No nausea No vomiting No abdominal pain No diarrhea No urinary symptoms No skin rashes + fatigue + myalgias + headache Used OTC meds without relief  Allergies  Morphine  Home Medications   Current Outpatient Rx  Name Route Sig Dispense Refill  . BENZONATATE 200 MG PO CAPS Oral Take 1 capsule (200 mg total) by mouth at bedtime. Take as  needed for cough 12 capsule 0  . CALCIUM 1500 MG PO TABS Oral Take 1,500 mg by mouth 2 (two) times daily.      Marland Kitchen VITAMIN D 1000 UNITS PO TABS Oral Take 1,000 Units by mouth daily.      . IBUPROFEN 200 MG PO TABS Oral Take 200 mg by mouth as needed.      Marland Kitchen LETROZOLE 2.5 MG PO TABS Oral Take 2.5 mg by mouth daily.      Marland Kitchen DAILY VITAMINS FOR WOMEN PO Oral Take 1 tablet by mouth daily.      Marland Kitchen PREDNISONE 10 MG PO TABS  Take 2 tabs by mouth twice daily for two days, then one tab twice daily for 2 days, then 1 tab daily for two days.  Take PC 14 tablet 0    BP 108/73  Temp(Src) 99.6 F (37.6 C) (Oral)  Resp 14  Ht 5' 7.5" (1.715 m)  Wt 167 lb 1.9 oz (75.805 kg)  BMI 25.79 kg/m2  SpO2 97%  Physical Exam Nursing notes and Vital Signs reviewed. Appearance:  Patient appears healthy, stated age, and in no acute distress Eyes:  Pupils are equal, round, and reactive to light and accomodation.  Extraocular movement is  intact.  Conjunctivae are not inflamed  Ears:  Canals normal.  Right tympanic membrane is mildly erythematous; left tympanic membrane is normal. Nose:  Mildly congested turbinates, worse on the right.  No sinus tenderness.  Pharynx:  Normal Neck:  Supple.  Slightly tender shotty posterior nodes are palpated bilaterally  Lungs:  Clear to auscultation.  Breath sounds are equal.  Heart:  Regular rate and rhythm without murmurs, rubs, or gallops.  Abdomen:  Nontender without masses or hepatosplenomegaly.  Bowel sounds are present.  No CVA or flank tenderness.  Extremities:  No edema.  No calf tenderness Skin:  No rash present.   ED Course  Procedures  none   Labs Reviewed  POCT RAPID STREP A (OFFICE) negative      1. Right acute otitis media;  Appears to be improving  2. Acute upper respiratory infections of unspecified site       MDM  Rx written for tapering course of prednisone because of persistent eustachian tube dysfunction.  Prescription written for Benzonatate  [facility]) to take at bedtime for night-time cough.  Take plain Mucinex (guaifenesin) twice daily for cough and congestion.  Increase fluid intake, rest. May use Afrin nasal spray (or generic oxymetazoline) twice daily for about 5 days.  Also recommend using saline nasal spray several times daily and saline nasal irrigation (AYR is a common brand) Stop all antihistamines for now, and other non-prescription cough/cold preparations. Finish azithromycin. Follow-up with family doctor if not improving 7 to 10 days.         Lattie Haw, MD 11/08/11 1355

## 2011-11-06 NOTE — ED Notes (Signed)
Patient c/o sore throat, cough and HA x 2 days. Right ear pain x today. Taking z-pak sx 2 days. Also c/o low back pain x 2-3 days. Hx of low back pain in the past. Patient did yard work Saturday and believes she may have pulled a muscle.

## 2011-11-06 NOTE — Discharge Instructions (Signed)
Take plain Mucinex (guaifenesin) twice daily for cough and congestion.  Increase fluid intake, rest. May use Afrin nasal spray (or generic oxymetazoline) twice daily for about 5 days.  Also recommend using saline nasal spray several times daily and saline nasal irrigation (AYR is a common brand) Stop all antihistamines for now, and other non-prescription cough/cold preparations. Finish azithromycin. Follow-up with family doctor if not improving 7 to 10 days.

## 2011-11-29 ENCOUNTER — Telehealth: Payer: Self-pay | Admitting: Oncology

## 2011-11-29 ENCOUNTER — Telehealth: Payer: Self-pay | Admitting: *Deleted

## 2011-11-29 NOTE — Telephone Encounter (Signed)
per conversation with patient requested to move the appointment to 12-31-2011 starting at 8:15am

## 2011-11-29 NOTE — Telephone Encounter (Signed)
S/w the and she is aware that her may appts have been cancelled and r/s for June.

## 2011-12-07 ENCOUNTER — Other Ambulatory Visit: Payer: BC Managed Care – PPO | Admitting: Lab

## 2011-12-07 ENCOUNTER — Ambulatory Visit: Payer: BC Managed Care – PPO | Admitting: Oncology

## 2011-12-31 ENCOUNTER — Other Ambulatory Visit (HOSPITAL_BASED_OUTPATIENT_CLINIC_OR_DEPARTMENT_OTHER)

## 2011-12-31 ENCOUNTER — Other Ambulatory Visit: Payer: Self-pay | Admitting: Physician Assistant

## 2011-12-31 ENCOUNTER — Telehealth: Payer: Self-pay | Admitting: *Deleted

## 2011-12-31 ENCOUNTER — Ambulatory Visit (HOSPITAL_BASED_OUTPATIENT_CLINIC_OR_DEPARTMENT_OTHER): Admitting: Physician Assistant

## 2011-12-31 VITALS — BP 105/73 | HR 83 | Temp 98.8°F | Ht 67.5 in | Wt 165.8 lb

## 2011-12-31 DIAGNOSIS — Z853 Personal history of malignant neoplasm of breast: Secondary | ICD-10-CM

## 2011-12-31 DIAGNOSIS — C50919 Malignant neoplasm of unspecified site of unspecified female breast: Secondary | ICD-10-CM

## 2011-12-31 DIAGNOSIS — Z170 Estrogen receptor positive status [ER+]: Secondary | ICD-10-CM

## 2011-12-31 DIAGNOSIS — E559 Vitamin D deficiency, unspecified: Secondary | ICD-10-CM

## 2011-12-31 DIAGNOSIS — C50419 Malignant neoplasm of upper-outer quadrant of unspecified female breast: Secondary | ICD-10-CM

## 2011-12-31 LAB — CBC WITH DIFFERENTIAL/PLATELET
BASO%: 1.5 % (ref 0.0–2.0)
Eosinophils Absolute: 0.1 10*3/uL (ref 0.0–0.5)
MCHC: 34.1 g/dL (ref 31.5–36.0)
MCV: 90.5 fL (ref 79.5–101.0)
MONO%: 8 % (ref 0.0–14.0)
NEUT#: 1.5 10*3/uL (ref 1.5–6.5)
RBC: 4.53 10*6/uL (ref 3.70–5.45)
RDW: 12.9 % (ref 11.2–14.5)
WBC: 3.5 10*3/uL — ABNORMAL LOW (ref 3.9–10.3)
nRBC: 0 % (ref 0–0)

## 2011-12-31 NOTE — Telephone Encounter (Signed)
gave patient appointment for 06-2012 patient stated she could not come in a week before the md appointment for labs gave patient schedule with the labs on the same day

## 2012-01-01 LAB — COMPREHENSIVE METABOLIC PANEL
ALT: 12 U/L (ref 0–35)
AST: 15 U/L (ref 0–37)
Alkaline Phosphatase: 73 U/L (ref 39–117)
Calcium: 9.6 mg/dL (ref 8.4–10.5)
Chloride: 105 mEq/L (ref 96–112)
Creatinine, Ser: 0.79 mg/dL (ref 0.50–1.10)
Total Bilirubin: 0.9 mg/dL (ref 0.3–1.2)

## 2012-01-01 LAB — VITAMIN D 25 HYDROXY (VIT D DEFICIENCY, FRACTURES): Vit D, 25-Hydroxy: 59 ng/mL (ref 30–89)

## 2012-01-02 NOTE — Progress Notes (Signed)
Hematology and Oncology Follow Up Visit  Leah Ortiz [id number] 1966/02/13 46 y.o. 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 She completed a years worth of Herceptin March 2010. She was placed on tamoxifen 20 mg by mouth daily September 2009 she took for June of 2011, after which she was started on Femara following a bilateral salpingo-oophorectomy.  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 She admits at this point in time she has discontinued letrozole. She states she's been off for about a month, and was not interested in resuming adjuvant hormonal therapy. She is having quite a bit emotional lability and joint sensitivity. This has completely abated. She does issues under quite a bit of stress at this point, her husband is now part of the [place], and in the process of possibly relocating to [place] [place].  She feels well though, denying any unexplained fevers, chills, night sweats, no shortness of breath, or chest pain issues. Her energy level has improved. She denies any nausea, emesis, diarrhea, or constipation issues. She denies any palpable breast reconstruction site nodules or fullness within the axilla.  A detailed review of systems is otherwise noncontributory as noted below.  Review of Systems: Constitutional:  no weight loss, fever, night sweats and feels well Eyes: no complaints ENT: no complaints Cardiovascular: no chest pain or dyspnea on exertion Respiratory: no cough, shortness of breath, or wheezing Neurological: no TIA or stroke symptoms Dermatological:  negative Gastrointestinal: no abdominal pain, change in bowel habits, or black or bloody stools Genito-Urinary: no dysuria, trouble voiding, or hematuria Hematological and Lymphatic: negative Breast: negative Musculoskeletal: negative Remaining ROS negative.   Medications:   I have reviewed the patient's current medications.  Current Outpatient Prescriptions  Medication Sig Dispense Refill  . Calcium 1500 MG tablet Take 1,500 mg by mouth 2 (two) times daily.        . cholecalciferol (VITAMIN D) 1000 UNITS tablet Take 1,000 Units by mouth daily.        Marland Kitchen ibuprofen (ADVIL,MOTRIN) 200 MG tablet Take 200 mg by mouth as needed.        Marland Kitchen letrozole (FEMARA) 2.5 MG tablet Take 2.5 mg by mouth daily.        . Multiple Vitamins-Calcium (DAILY VITAMINS FOR WOMEN PO) Take 1 tablet by mouth daily.        . predniSONE (DELTASONE) 10 MG tablet Take 2 tabs by mouth twice daily for two days, then one tab twice daily for 2 days, then 1 tab daily for two days.  Take PC  14 tablet  0    Allergies:  Allergies  Allergen Reactions  . Morphine     REACTION: GI Upset    Physical Exam: Filed Vitals:   12/31/11 0920  BP: 105/73  Pulse: 83  Temp: 98.8 F (37.1 C)    Body mass index is 25.58 kg/(m^2). Weight: 165lbs. HEENT:  Sclerae anicteric, conjunctivae pink.  Oropharynx clear.  No mucositis or candidiasis.   Nodes:  No cervical, supraclavicular, or axillary lymphadenopathy palpated.  Breast Exam: Bilateral breast reconstruction sites are benign without any skin nodules or masses appreciated. The axilla is clear of any palpable lymphadenopathy or  fullness bilaterally. Lungs:  Clear to auscultation bilaterally.  No crackles, rhonchi, or wheezes.   Heart:  Regular rate and rhythm.   Abdomen:  Soft, nontender.  Positive bowel sounds.  No organomegaly or masses palpated.   Musculoskeletal:  No focal spinal tenderness to palpation.  Extremities:  Benign.  No peripheral edema or cyanosis.   Skin:   Benign.   Neuro:  Nonfocal, alert and oriented x3.   Lab Results: Lab Results  Component Value Date   WBC 3.5* 12/31/2011   HGB 14.0 12/31/2011   HCT 41.0 12/31/2011   MCV 90.5 12/31/2011   PLT 227 12/31/2011   NEUTROABS 1.5 12/31/2011     Chemistry      Component Value Date/Time   NA 141 12/31/2011 0856   K 4.1 12/31/2011 0856   CL 105 12/31/2011 0856   CO2 26 12/31/2011 0856   BUN 10 12/31/2011 0856   CREATININE 0.79 12/31/2011 0856      Component Value Date/Time   CALCIUM 9.6 12/31/2011 0856   ALKPHOS 73 12/31/2011 0856   AST 15 12/31/2011 0856   ALT 12 12/31/2011 0856   BILITOT 0.9 12/31/2011 0856      Lab Results  Component Value Date   LABCA2 28 12/31/2011    Assessment:  Patient is a 46 year old [place] woman with a history of 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 She completed a years worth of Herceptin in March 2010. She was placed on tamoxifen 20 mg by mouth daily September 2009 which she took until June of 2011, after which she was started on Femara following a bilateral salpingo-oophorectomy, but has since discontinued due to drug sensitivity and emotional lability.  Case reviewed with Dr. Pierce Crane.   Plan:  Kanesha is willing to try Aromasin 25 mg by mouth daily. Therefore, prescription has been provided. From our standpoint, we will see her back in about 6 months for routine oncologic followup which will include a CBC, serum chemistry, LDH, and CA 27-29. In the interim, if she is relocating to [place], she will give [place] a call to see about possible referral to a medical oncologist in that region.  This plan was reviewed with the patient, who voices understanding and agreement.  She knows to call with any changes or problems.    Nacole Fluhr T, PA-C 12/31/11

## 2012-01-04 ENCOUNTER — Encounter: Admitting: Physician Assistant

## 2012-01-21 ENCOUNTER — Ambulatory Visit: Payer: Commercial Managed Care - PPO | Admitting: Oncology

## 2012-01-21 ENCOUNTER — Other Ambulatory Visit: Payer: Commercial Managed Care - PPO | Admitting: Lab

## 2012-06-18 ENCOUNTER — Telehealth: Payer: Self-pay | Admitting: *Deleted

## 2012-06-18 NOTE — Telephone Encounter (Signed)
patient confirmed over the phone the new date and time on12-03-2012 starting at 1:30pm

## 2012-07-07 ENCOUNTER — Other Ambulatory Visit (HOSPITAL_BASED_OUTPATIENT_CLINIC_OR_DEPARTMENT_OTHER): Admitting: Lab

## 2012-07-07 ENCOUNTER — Ambulatory Visit (HOSPITAL_BASED_OUTPATIENT_CLINIC_OR_DEPARTMENT_OTHER): Admitting: Oncology

## 2012-07-07 VITALS — BP 114/74 | HR 82 | Temp 98.5°F | Resp 20 | Ht 67.5 in | Wt 177.3 lb

## 2012-07-07 DIAGNOSIS — Z853 Personal history of malignant neoplasm of breast: Secondary | ICD-10-CM

## 2012-07-07 DIAGNOSIS — Z170 Estrogen receptor positive status [ER+]: Secondary | ICD-10-CM

## 2012-07-07 DIAGNOSIS — C50919 Malignant neoplasm of unspecified site of unspecified female breast: Secondary | ICD-10-CM

## 2012-07-07 LAB — CBC WITH DIFFERENTIAL/PLATELET
BASO%: 1.2 % (ref 0.0–2.0)
Eosinophils Absolute: 0.1 10*3/uL (ref 0.0–0.5)
HCT: 41.2 % (ref 34.8–46.6)
LYMPH%: 49 % (ref 14.0–49.7)
MCHC: 34.6 g/dL (ref 31.5–36.0)
MCV: 90.4 fL (ref 79.5–101.0)
MONO%: 8.8 % (ref 0.0–14.0)
NEUT%: 39.6 % (ref 38.4–76.8)
Platelets: 231 10*3/uL (ref 145–400)
RBC: 4.56 10*6/uL (ref 3.70–5.45)

## 2012-07-07 LAB — COMPREHENSIVE METABOLIC PANEL (CC13)
ALT: 25 U/L (ref 0–55)
BUN: 11 mg/dL (ref 7.0–26.0)
CO2: 26 mEq/L (ref 22–29)
Calcium: 9.8 mg/dL (ref 8.4–10.4)
Creatinine: 0.8 mg/dL (ref 0.6–1.1)
Total Bilirubin: 0.66 mg/dL (ref 0.20–1.20)

## 2012-07-07 LAB — CANCER ANTIGEN 27.29: CA 27.29: 31 U/mL (ref 0–39)

## 2012-07-07 LAB — LACTATE DEHYDROGENASE (CC13): LDH: 165 U/L (ref 125–245)

## 2012-07-07 NOTE — Progress Notes (Signed)
Hematology and Oncology Follow Up Visit  Leah Ortiz [id number] 04-04-1966 46 y.o. 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 She completed a years worth of Herceptin March 2010. She was placed on tamoxifen 20 mg by mouth daily September 2009 she took for June of 2011, after which she was started on Femara following a bilateral salpingo-oophorectomy.  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 She was last started on aromasin and stopped this in 11/2011. She feels better both emotionally and physically.   A detailed review of systems is otherwise noncontributory as noted below.  Review of Systems: Constitutional:  no weight loss, fever, night sweats and feels well Eyes: no complaints ENT: no complaints Cardiovascular: no chest pain or dyspnea on exertion Respiratory: no cough, shortness of breath, or wheezing Neurological: no TIA or stroke symptoms Dermatological: negative Gastrointestinal: no abdominal pain, change in bowel habits, or black or bloody stools Genito-Urinary: no dysuria, trouble voiding, or hematuria Hematological and Lymphatic: negative Breast: negative Musculoskeletal: negative Remaining ROS negative.   Medications:   I have reviewed the patient's current medications.  Current Outpatient Prescriptions  Medication Sig Dispense Refill  . Calcium 1500 MG tablet Take 1,500 mg by mouth 2 (two) times daily.        . cholecalciferol (VITAMIN D) 1000 UNITS tablet Take 1,000 Units by mouth daily.        . fish oil-omega-3 fatty acids 1000 MG  capsule Take 1,200 mg by mouth daily.      Marland Kitchen ibuprofen (ADVIL,MOTRIN) 200 MG tablet Take 200 mg by mouth as needed.        . vitamin C (ASCORBIC ACID) 500 MG tablet Take 500 mg by mouth daily.        Allergies:  Allergies  Allergen Reactions  . Morphine     REACTION: GI Upset    Physical Exam: Filed Vitals:   07/07/12 1411  BP: 114/74  Pulse: 82  Temp: 98.5 F (36.9 C)  Resp: 20    Body mass index is 27.36 kg/(m^2). Weight: 165lbs. HEENT:  Sclerae anicteric, conjunctivae pink.  Oropharynx clear.  No mucositis or candidiasis.   Nodes:  No cervical, supraclavicular, or axillary lymphadenopathy palpated.  Breast Exam: Bilateral breast reconstruction sites are benign without any skin nodules or masses appreciated. The axilla is clear of any palpable lymphadenopathy or fullness bilaterally. Lungs:  Clear to auscultation bilaterally.  No crackles, rhonchi, or wheezes.   Heart:  Regular rate and rhythm.   Abdomen:  Soft, nontender.  Positive bowel sounds.  No organomegaly or masses palpated.   Musculoskeletal:  No focal spinal tenderness to palpation.  Extremities:  Benign.  No peripheral edema or cyanosis.   Skin:  Benign.   Neuro:  Nonfocal, alert and oriented x3.   Lab Results: Lab Results  Component Value Date   WBC 4.7 07/07/2012   HGB 14.3 07/07/2012   HCT 41.2 07/07/2012   MCV 90.4 07/07/2012   PLT 231 07/07/2012   NEUTROABS 1.9 07/07/2012     Chemistry      Component Value Date/Time   NA 139 07/07/2012 1345   NA 141 12/31/2011 0856  K 3.8 07/07/2012 1345   K 4.1 12/31/2011 0856   CL 103 07/07/2012 1345   CL 105 12/31/2011 0856   CO2 26 07/07/2012 1345   CO2 26 12/31/2011 0856   BUN 11.0 07/07/2012 1345   BUN 10 12/31/2011 0856   CREATININE 0.8 07/07/2012 1345   CREATININE 0.79 12/31/2011 0856      Component Value Date/Time   CALCIUM 9.8 07/07/2012 1345   CALCIUM 9.6 12/31/2011 0856   ALKPHOS 81 07/07/2012 1345   ALKPHOS 73 12/31/2011 0856   AST 19 07/07/2012 1345   AST 15  12/31/2011 0856   ALT 25 07/07/2012 1345   ALT 12 12/31/2011 0856   BILITOT 0.66 07/07/2012 1345   BILITOT 0.9 12/31/2011 0856      Lab Results  Component Value Date   LABCA2 28 12/31/2011    Assessment:  Patient is a 46 year old [place] woman with a history of 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 She completed a years worth of Herceptin in March 2010. She was placed on tamoxifen 20 mg by mouth daily September 2009 which she took until June of 2011, after which she was started on Femara following a bilateral salpingo-oophorectomy, but has since discontinued due to drug sensitivity and emotional lability.     Plan:  Leah Ortiz is doing well. She is not interested in beginning a new anti-hormone drug. She is likely moving in the next few months and so i have not advised her to return but to f/u with a primary care md, should the need arise.This plan was reviewed with the patient, who voices understanding and agreement. I have not scheduled her for f/u. She knows to call with any changes or problems.    Leah Howland, md 12/31/11

## 2012-07-08 ENCOUNTER — Ambulatory Visit: Admitting: Oncology

## 2012-07-08 ENCOUNTER — Other Ambulatory Visit: Admitting: Lab

## 2012-07-29 ENCOUNTER — Other Ambulatory Visit: Admitting: Lab

## 2013-10-21 ENCOUNTER — Telehealth: Payer: Self-pay | Admitting: Oncology

## 2013-10-21 NOTE — Telephone Encounter (Signed)
Mailed pts medical records to [facility] in [place], [place]

## 2015-01-26 ENCOUNTER — Encounter: Payer: Self-pay | Admitting: Genetic Counselor

## 2016-01-20 ENCOUNTER — Encounter: Payer: Self-pay | Admitting: Genetic Counselor

## 2020-03-09 ENCOUNTER — Encounter: Payer: Self-pay | Admitting: Genetic Counselor
